# Patient Record
Sex: Male | Born: 1980
Health system: Southern US, Community
[De-identification: ages and names within clinical notes are randomized; demographics above are authoritative.]

## PROBLEM LIST (undated history)

## (undated) DIAGNOSIS — K2270 Barrett's esophagus without dysplasia: Secondary | ICD-10-CM

## (undated) DIAGNOSIS — R011 Cardiac murmur, unspecified: Secondary | ICD-10-CM

## (undated) DIAGNOSIS — E785 Hyperlipidemia, unspecified: Secondary | ICD-10-CM

## (undated) DIAGNOSIS — K219 Gastro-esophageal reflux disease without esophagitis: Secondary | ICD-10-CM

## (undated) HISTORY — DX: Barrett's esophagus without dysplasia: K22.70

## (undated) HISTORY — DX: Gastro-esophageal reflux disease without esophagitis: K21.9

## (undated) HISTORY — DX: Hyperlipidemia, unspecified: E78.5

## (undated) HISTORY — DX: Cardiac murmur, unspecified: R01.1

---

## 2014-04-13 LAB — HEPATIC FUNCTION PANEL
ALT: 38 (ref 10–40)
AST: 21 (ref 14–40)
Alkaline Phosphatase: 87 (ref 25–125)
Bilirubin, Total: 1.1

## 2014-04-13 LAB — BASIC METABOLIC PANEL
BUN: 17 (ref 4–21)
Creatinine: 0.9 (ref <=1.3)
Glucose: 99
POTASSIUM: 4.2 (ref 3.4–5.3)
SODIUM: 139 (ref 137–147)

## 2014-04-13 LAB — LIPID PANEL
CHOLESTEROL: 201 — AB (ref 0–200)
HDL: 57 (ref 35–70)
LDL Cholesterol: 130
Triglycerides: 69 (ref 40–160)

## 2014-11-19 ENCOUNTER — Telehealth: Payer: Self-pay | Admitting: Cardiovascular Disease

## 2014-11-19 ENCOUNTER — Emergency Department: Payer: Self-pay | Admitting: Emergency Medicine

## 2014-11-19 NOTE — Telephone Encounter (Signed)
[facility]/c 11[facility]19-14 timeframe for fu per ED is early next week Wife prefers patient to see Dr. Kirke CorinArida  Patient was admitted to ED today following 911 call for chest pain EKG taken by ems was normal per wife and patients HR in ED was in 40's resting 100's standing BP 90/52  Pt c/o of Chest Pain: STAT if CP now or developed within 24 hours  1. Are you having CP right now? No   2. Are you experiencing any other symptoms (ex. SOB, nausea, vomiting, sweating)? Gray in color sweaty, pain starting in stomach radiating to chest EMS arrived 10-15 minutes later and ekg was normal (per wife)   3. How long have you been experiencing CP? 11-19-14 today first episode  4. Is your CP continuous or coming and going? Single episode this morning   5. Have you taken Nitroglycerin? No    Patient wife was advised that Arida's triage nurse will call her back to ask more detailed questions to determine appt. Date/time   ?

## 2014-11-19 NOTE — Telephone Encounter (Signed)
Informed patient of appt date and time  Advised him to go to the ED if his situation became emergent before then  Patient verbalized understanding

## 2014-11-19 NOTE — Telephone Encounter (Signed)
LVM to inform patient they have been scheduled for 11/29/14 at 11:15 am with Dr. Kirke CorinArida

## 2014-11-19 NOTE — Telephone Encounter (Signed)
This was patients first episode of chest pain per wife no cardiac history never seen by cardiologist   [facility]

## 2014-11-29 ENCOUNTER — Encounter: Payer: Self-pay | Admitting: Cardiovascular Disease

## 2014-11-29 ENCOUNTER — Encounter (INDEPENDENT_AMBULATORY_CARE_PROVIDER_SITE_OTHER): Payer: Self-pay

## 2014-11-29 ENCOUNTER — Ambulatory Visit (INDEPENDENT_AMBULATORY_CARE_PROVIDER_SITE_OTHER): Payer: BLUE CROSS/BLUE SHIELD | Admitting: Cardiovascular Disease

## 2014-11-29 VITALS — BP 114/70 | HR 55 | Ht 70.0 in | Wt 212.0 lb

## 2014-11-29 DIAGNOSIS — R55 Syncope and collapse: Secondary | ICD-10-CM

## 2014-11-29 DIAGNOSIS — R079 Chest pain, unspecified: Secondary | ICD-10-CM | POA: Insufficient documentation

## 2014-11-29 NOTE — Assessment & Plan Note (Signed)
This was likely vasovagal and consistent with his prior history of this event in the setting of blood draws. Continue to monitor.

## 2014-11-29 NOTE — Assessment & Plan Note (Signed)
The chest pain was overall atypical and likely musculoskeletal. However, given his family history of premature coronary artery disease, I requested a stress echocardiogram. He does have sinus bradycardia which is likely chronic. Evaluate for chronotropic incompetence during stress test.

## 2014-11-29 NOTE — Progress Notes (Signed)
  Primary care physician: Dr. Sherryll BurgerShah  HPI  This is a pleasant 34 year old male who was [facility] for evaluation of chest pain or presyncope. He has no previous cardiac history and no significant chronic medical conditions. He is not a smoker and does not drink alcohol. He does have family history of premature coronary artery disease affecting both his father and uncle. However, both of them were smokers. He had a recent episode of sharp left sided chest pain at rest followed by sweating and then feeling extremely dizzy with presyncope but no complete loss of consciousness. He was noted to be bradycardic by EMS with heart rates in the 30s and 40s. Basic workup in the emergency room was unremarkable. His BUN was slightly elevated at 23. He does report history of vasovagal dizziness especially during blood draws. He denies palpitations. No significant exertional symptoms.  No Known Allergies   No current outpatient prescriptions on file prior to visit.   No current facility-administered medications on file prior to visit.     Past Medical History  Diagnosis Date  . Heart murmur   . Hyperlipidemia      History reviewed. No pertinent past surgical history.   Family History  Problem Relation Age of Onset  . Heart attack Father      History   Social History  . Marital Status: Married    Spouse Name: N/A  . Number of Children: N/A  . Years of Education: N/A   Occupational History  . Not on file.   Social History Main Topics  . Smoking status: Never Smoker   . Smokeless tobacco: Not on file  . Alcohol Use: No  . Drug Use: No  . Sexual Activity: Not on file   Other Topics Concern  . Not on file   Social History Narrative  . No narrative on file     ROS A 10 point review of system was performed. It is negative other than that mentioned in the history of present illness.   PHYSICAL EXAM   BP 114/70 mmHg  Pulse 55  Ht 5\' 10"  (1.778 m)   Wt 212 lb (96.163 kg)  BMI 30.42 kg/m2 Constitutional: He is oriented to person, place, and time. He appears well-developed and well-nourished. No distress.  HENT: No nasal discharge.  Head: Normocephalic and atraumatic.  Eyes: Pupils are equal and round.  No discharge. Neck: Normal range of motion. Neck supple. No JVD present. No thyromegaly present.  Cardiovascular: Normal rate, regular rhythm, normal heart sounds. Exam reveals no gallop and no friction rub. No murmur heard.  Pulmonary/Chest: Effort normal and breath sounds normal. No stridor. No respiratory distress. He has no wheezes. He has no rales. He exhibits no tenderness.  Abdominal: Soft. Bowel sounds are normal. He exhibits no distension. There is no tenderness. There is no rebound and no guarding.  Musculoskeletal: Normal range of motion. He exhibits no edema and no tenderness.  Neurological: He is alert and oriented to person, place, and time. Coordination normal.  Skin: Skin is warm and dry. No rash noted. He is not diaphoretic. No erythema. No pallor.  Psychiatric: He has a normal mood and affect. His behavior is normal. Judgment and thought content normal.       ZOX:WRUEAEKG:Sinus  Bradycardia  WITHIN NORMAL LIMITS   ASSESSMENT AND PLAN

## 2014-11-29 NOTE — Patient Instructions (Addendum)
Your physician has requested that you have a stress echocardiogram. For further information please visit [email or web address]. Please follow instruction sheet as given. - You can eat breakfast - take all medications- -where comfortable clothes and shoes that lace up - no perfume, cologne, lotions  Your physician recommends that you schedule a follow-up appointment as needed

## 2014-12-13 ENCOUNTER — Ambulatory Visit
Admit: 2014-12-13 | Disposition: A | Discharge status: Home / Self Care | Payer: Self-pay | Attending: Gastroenterology | Admitting: Gastroenterology

## 2014-12-13 HISTORY — PX: COLONOSCOPY WITH ESOPHAGOGASTRODUODENOSCOPY (EGD): SHX5779

## 2014-12-13 LAB — HM COLONOSCOPY

## 2014-12-28 ENCOUNTER — Ambulatory Visit (INDEPENDENT_AMBULATORY_CARE_PROVIDER_SITE_OTHER): Payer: BLUE CROSS/BLUE SHIELD

## 2014-12-28 DIAGNOSIS — R079 Chest pain, unspecified: Secondary | ICD-10-CM

## 2014-12-28 DIAGNOSIS — R55 Syncope and collapse: Secondary | ICD-10-CM | POA: Diagnosis not present

## 2014-12-31 ENCOUNTER — Telehealth: Payer: Self-pay

## 2014-12-31 NOTE — Telephone Encounter (Signed)
Normal stress echo.             ----- Message -----     From: Scarlette ArFalecha L Clark     Sent: 12/29/2014 11:23 AM      To: Iran OuchMuhammad A Arida, MD        ECHO REPORT SCAN IN UNDER ORDER -LEVEL DOCUMENT

## 2014-12-31 NOTE — Telephone Encounter (Signed)
Left message on pt's vm w/ results. Asked him to call back w/ any questions or concerns.

## 2017-03-22 ENCOUNTER — Telehealth: Payer: Self-pay | Admitting: Family Medicine

## 2017-03-22 DIAGNOSIS — M5441 Lumbago with sciatica, right side: Secondary | ICD-10-CM

## 2017-03-22 MED ORDER — PREDNISONE 10 MG PO TABS: ORAL_TABLET | ORAL | 0 refills | Status: DC

## 2017-03-22 MED ORDER — CYCLOBENZAPRINE HCL 10 MG PO TABS: 5.0000 mg | ORAL_TABLET | Freq: Three times a day (TID) | ORAL | 0 refills | Status: DC | PRN

## 2017-03-22 NOTE — Telephone Encounter (Addendum)
Received notification that patient has been having significant acute low back pain with L sided sciatica symptoms with nerve pain. No acute injury, but thinks related to strain at work. No significant chronic back pain problems before. Requesting some treatment before upcoming new patient evaluation. Agree to do trial course of meds for patient and then follow-up progress in 2 weeks, as scheduled. Otherwise may seek more immediate treatment at ED or Urgent Care. Sent rx prednisone taper and flexeril PRN, relative rest advised.  Jay PilarAlexander Lastacia Solum, DO [facility] 03/22/2017, 9:51 AM

## 2017-04-05 ENCOUNTER — Ambulatory Visit (INDEPENDENT_AMBULATORY_CARE_PROVIDER_SITE_OTHER): Payer: 59 | Admitting: Family Medicine

## 2017-04-05 ENCOUNTER — Encounter: Payer: Self-pay | Admitting: Family Medicine

## 2017-04-05 VITALS — BP 135/78 | HR 61 | Temp 98.2°F | Resp 16 | Ht 70.0 in | Wt 209.0 lb

## 2017-04-05 DIAGNOSIS — Z8739 Personal history of other diseases of the musculoskeletal system and connective tissue: Secondary | ICD-10-CM

## 2017-04-05 DIAGNOSIS — E785 Hyperlipidemia, unspecified: Secondary | ICD-10-CM | POA: Insufficient documentation

## 2017-04-05 DIAGNOSIS — K22719 Barrett's esophagus with dysplasia, unspecified: Secondary | ICD-10-CM | POA: Diagnosis not present

## 2017-04-05 DIAGNOSIS — Z7689 Persons encountering health services in other specified circumstances: Secondary | ICD-10-CM

## 2017-04-05 DIAGNOSIS — E782 Mixed hyperlipidemia: Secondary | ICD-10-CM | POA: Diagnosis not present

## 2017-04-05 DIAGNOSIS — M5441 Lumbago with sciatica, right side: Secondary | ICD-10-CM

## 2017-04-05 DIAGNOSIS — K2100 Gastro-esophageal reflux disease with esophagitis, without bleeding: Secondary | ICD-10-CM

## 2017-04-05 DIAGNOSIS — K2270 Barrett's esophagus without dysplasia: Secondary | ICD-10-CM | POA: Insufficient documentation

## 2017-04-05 NOTE — Patient Instructions (Addendum)
Thank you for coming to the clinic today.  1. Great to meet you! 2. Try to improve a little with some regular exercise and improved diet 3. Stay well hydrated 4. Use sunscreen if out in sun too long 5. I'm not worried about the R lower leg, I think these are superficial blood vessels or capillaries, likely irritated by sun  I think that this is due to Muscle Spasms or strain. Your Sciatic Nerve can be affected causing some of your radiation and numbness down your legs.  If another flare - may take either Ibuprofen or ALeve OTC  If you do aleve it is 1-2 pills  twice daily (12 hrs apart, with food, breakfast and dinner) every day for few days to 1-2 weeks, then can use only as needed  If need muscle relaxant that is fine, can try the flexeril again  May use Tylenol Extra Str 500mg  tabs - may take 1-2 tablets every 6 hours as needed  Recommend to start using heating pad on your lower back 1-2x daily for few weeks  This pain may take weeks to months to fully resolve, but hopefully it will respond to the medicine initially. All back injuries (small or serious) are slow to heal since we use our back muscles every day. Be careful with turning, twisting, lifting, sitting / standing for prolonged periods, and avoid re-injury.  If your symptoms significantly worsen with more pain, or new symptoms with weakness in one or both legs, new or different shooting leg pains, numbness in legs or groin, loss of control or retention of urine or bowel movements, please call back for advice and you may need to go directly to the Emergency Department.  DUE for FASTING BLOOD WORK (no food or drink after midnight before the lab appointment, only water or coffee without cream/sugar on the morning of)  SCHEDULE "Lab Only" visit in the morning at the clinic for lab draw in 3 MONTHS   - Make sure Lab Only appointment is at about 1 week before your next appointment, so that results will be available  For Lab  Results, once available within 2-3 days of blood draw, you can can log in to MyChart online to view your results and a brief explanation. Also, we can discuss results at next follow-up visit.   Please schedule a Follow-up Appointment to: Return in about 3 months (around 07/06/2017) for Annual Physical.  If you have any other questions or concerns, please feel free to call the clinic or send a message through MyChart. You may also schedule an earlier appointment if necessary.  Additionally, you may be receiving a survey about your experience at our clinic within a few days to 1 week by e-mail or mail. We value your feedback.  Saralyn PilarAlexander Kani Jobson, DO [facility]             Low Back Pain Exercises  See other page with pictures of each exercise.  Start with 1 or 2 of these exercises that you are most comfortable with. Do not do any exercises that cause you significant worsening pain. Some of these may cause some "stretching soreness" but it should go away after you stop the exercise, and get better over time. Gradually increase up to 3-4 exercises as tolerated.  Standing hamstring stretch: Place the heel of your leg on a stool about 15 inches high. Keep your knee straight. Lean forward, bending at the hips until you feel a mild stretch in the  back of your thigh. Make sure you do not roll your shoulders and bend at the waist when doing this or you will stretch your lower back instead. Hold the stretch for 15 to 30 seconds. Repeat 3 times. Repeat the same stretch on your other leg.  Cat and camel: Get down on your hands and knees. Let your stomach sag, allowing your back to curve downward. Hold this position for 5 seconds. Then arch your back and hold for 5 seconds. Do 3 sets of 10.  Quadriped Arm/Leg Raises: Get down on your hands and knees. Tighten your abdominal muscles to stiffen your spine. While keeping your abdominals tight, raise one arm and the opposite  leg away from you. Hold this position for 5 seconds. Lower your arm and leg slowly and alternate sides. Do this 10 times on each side.  Pelvic tilt: Lie on your back with your knees bent and your feet flat on the floor. Tighten your abdominal muscles and push your lower back into the floor. Hold this position for 5 seconds, then relax. Do 3 sets of 10.  Partial curl: Lie on your back with your knees bent and your feet flat on the floor. Tighten your stomach muscles and flatten your back against the floor. Tuck your chin to your chest. With your hands stretched out in front of you, curl your upper body forward until your shoulders clear the floor. Hold this position for 3 seconds. Don't hold your breath. It helps to breathe out as you lift your shoulders up. Relax. Repeat 10 times. Build to 3 sets of 10. To challenge yourself, clasp your hands behind your head and keep your elbows out to the side.  Lower trunk rotation: Lie on your back with your knees bent and your feet flat on the floor. Tighten your abdominal muscles and push your lower back into the floor. Keeping your shoulders down flat, gently rotate your legs to one side, then the other as far as you can. Repeat 10 to 20 times.  Single knee to chest stretch: Lie on your back with your legs straight out in front of you. Bring one knee up to your chest and grasp the back of your thigh. Pull your knee toward your chest, stretching your buttock muscle. Hold this position for 15 to 30 seconds and return to the starting position. Repeat 3 times on each side.  Double knee to chest: Lie on your back with your knees bent and your feet flat on the floor. Tighten your abdominal muscles and push your lower back into the floor. Pull both knees up to your chest. Hold for 5 seconds and repeat 10 to 20 times.

## 2017-04-05 NOTE — Progress Notes (Signed)
Subjective:    Patient ID: Jay Buchanan, male    DOB: 13-Mar-1981, 36 y.o.   MRN: [id number]  Jay Buchanan is a 36 y.o. male presenting on 04/05/2017 for Establish Care  [facility]  HPI   Here to get established, and plans to follow-up in future for Annual Physical. He has some records from prior PCP with him today.  HYPERLIPIDEMIA / Lifestyle: - Reports no specific concerns. 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, sometimes pallets are very heavy, but no significant injury except x 1 slip and fall frozen truck >1 year ago no real injury - Interval history L LBP and radiating pain down to knee, not below, intermittent tingling and numbness, not affecting him if he was up and walking around, worse if stop or prolonged sitting recently treated with prednisone burst and flexeril 2 weeks ago, improved on prednisone since he had sciatica symptoms, and no significant relief on flexeril, stopped taking it - Takes occasional Ibuprofen few pills at night (x 4 of 200mg ) PRN, infrequent 1-2 x monthly - Rarely takes Tylenol - History of shoulder problems 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, 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 does this and his shoulders are fine - No prior back imaging or x-ray  GERD / Barrett's Esophagus Followed by Dr [facility], had EGD and Colonoscopy on 12/13/14, see chart for details, review with EGD for GERD and dx with esophagitis and found to have Barrett's esophagus - Continues  Nexium 20mg  daily, worse symptoms if off of it, tomatoes flare - Also history of hematochezia, and proceeded colonoscopy, dx with anal fissure without other abnormality, no polyps  Additional history episode in 2016 had problems with chest pain vs pre-syncope with dyspnea, evaluated by Cardiology Dr Kirke CorinArida in 11/2014, after sent to ED, had HR with bradycardia in 30-40s, thought to have vasovagal episode due to blood draws at ED, and chest pain determined to be atypical likely MSK vs GERD. He proceeded with a Stress ECHO test, in 12/2014 that was normal. - No further concerns.  Health Maintenance: - Declines routine HIV screen - Last colonoscopy 12/13/14 (diagnostic due to hematochezia, not screening) normal without polyps - not due again until age 36 most likely   Past Medical History:  Diagnosis Date  . Barrett's esophagus   . GERD (gastroesophageal reflux disease)   . Heart murmur   . Hyperlipidemia    History reviewed. No pertinent surgical history. Social History   Social History  . Marital status: Married    Spouse name: Oneal Groutshley Carol  . Number of children: 2  . Years of education: College   Occupational History  . 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 Smoking status: Never Smoker  . Smokeless tobacco: Never Used  . Alcohol use No  . Drug use: No  . Sexual activity: Not on file   Other Topics Concern  . Not on file   Social  History Narrative  . No narrative on file   Family History  Problem Relation Age of Onset  . Diabetes Father   . Cancer Father        skin  . Hypertension Father   . Prostate cancer Father   . Barrett's esophagus Father   . Heart disease Father   . Lung cancer Maternal Grandfather   . Bladder Cancer Maternal Grandfather    Current Outpatient Prescriptions on File Prior to Visit  Medication Sig  . esomeprazole (NEXIUM) 20 MG capsule Take 20 mg by mouth daily at 12 noon.  . cyclobenzaprine  (FLEXERIL) 10 MG tablet Take 0.5-1 tablets (5-10 mg total) by mouth 3 (three) times daily as needed for muscle spasms. (Patient not taking: Reported on 04/05/2017)   No current facility-administered medications on file prior to visit.     Review of Systems  Constitutional: Negative for activity change, appetite change, chills, diaphoresis, fatigue, fever and unexpected weight change.  HENT: Negative for congestion, hearing loss and sinus pressure.   Eyes: Negative for visual disturbance.  Respiratory: Negative for apnea, cough, chest tightness, shortness of breath and wheezing.   Cardiovascular: Negative for chest pain, palpitations and leg swelling.  Gastrointestinal: Negative for abdominal pain, anal bleeding, blood in stool, constipation, diarrhea, nausea and vomiting.  Endocrine: Negative for cold intolerance and polyuria.  Genitourinary: Negative for difficulty urinating, dysuria, frequency, hematuria and testicular pain.  Musculoskeletal: Positive for back pain (Improved now, L lower). Negative for arthralgias and neck pain.  Skin: Negative for rash.  Allergic/Immunologic: Negative for environmental allergies.  Neurological: Negative for dizziness, weakness, light-headedness, numbness and headaches.  Hematological: Negative for adenopathy.  Psychiatric/Behavioral: Negative for behavioral problems, dysphoric mood and sleep disturbance. The patient is not nervous/anxious.    Per HPI unless specifically indicated above     Objective:    BP 135/78   Pulse 61   Temp 98.2 F (36.8 C) (Oral)   Resp 16   Ht 5\' 10"  (1.778 m)   Wt 209 lb (94.8 kg)   BMI 29.99 kg/m   Wt Readings from Last 3 Encounters:  04/05/17 209 lb (94.8 kg)  11/29/14 212 lb (96.2 kg)    Physical Exam  Constitutional: He is oriented to person, place, and time. He appears well-developed and well-nourished. No distress.  Well-appearing, comfortable, cooperative  HENT:  Head: Normocephalic and atraumatic.    Mouth/Throat: Oropharynx is clear and moist.  Eyes: Conjunctivae are normal. Right eye exhibits no discharge. Left eye exhibits no discharge.  Neck: Normal range of motion. Neck supple. No thyromegaly present.  No carotid bruits  Cardiovascular: Normal rate, regular rhythm, normal heart sounds and intact distal pulses.   No murmur heard. Pulmonary/Chest: Effort normal and breath sounds normal. No respiratory distress. He has no wheezes. He has no rales.  Musculoskeletal: Normal range of motion. He exhibits no edema.  Low Back Inspection: Normal appearance, no spinal deformity, symmetrical. Palpation: No tenderness over spinous processes. Bilateral lumbar paraspinal muscles non-tender and without hypertonicity/spasm. ROM: Full active ROM forward flex / back extension, rotation L/R without discomfort Special Testing: Seated SLR negative for radicular pain bilaterally but with noticeable tightness in hamstrings and inflexibility of core/back, difficult to raise leg and not lean back due to some tight muscles  Strength: Bilateral hip flex/ext 5/5, knee flex/ext 5/5, ankle dorsiflex/plantarflex 5/5 Neurovascular: intact distal sensation to light touch  Lymphadenopathy:    He has no cervical adenopathy.  Neurological: He is alert and oriented to person,  place, and time.  Distal sensation intact to light touch  Skin: Skin is warm and dry. No rash noted. He is not diaphoretic. No erythema.  Right posterior calf with appearance of superficial capillaries, see picture, non tender, blanching, leg otherwise symmetrical  Psychiatric: He has a normal mood and affect. His behavior is normal.  Well groomed, good eye contact, normal speech and thoughts  Nursing note and vitals reviewed.   Right Leg posteriorly    Results for orders placed or performed in visit on 04/05/17  Basic metabolic panel  Result Value Ref Range   Glucose 99    BUN 17 4 - 21   Creatinine 0.9 0.6 - 1.3   Potassium 4.2 3.4 -  5.3   Sodium 139 137 - 147  Lipid panel  Result Value Ref Range   Triglycerides 69 40 - 160   Cholesterol 201 (A) 0 - 200   HDL 57 35 - 70   LDL Cholesterol 130   Hepatic function panel  Result Value Ref Range   Alkaline Phosphatase 87 25 - 125   ALT 38 10 - 40   AST 21 14 - 40   Bilirubin, Total 1.1   HM COLONOSCOPY  Result Value Ref Range   HM Colonoscopy See Report (in chart) See Report (in chart), Patient Reported      Assessment & Plan:   Problem List Items Addressed This Visit    Hyperlipidemia    Previously only mild abnormal result elevated LDL in 2015 Some poor lifestyle habits Last lipid panel 2015  Plan: 1. Check future fasting lipids with upcoming labs / annual phys few months 2. Encourage improved lifestyle - low carb/cholesterol, reduce portion size, continue improving start regular exercise      History of back pain    Resolved acute flare No significant chronic back pain but has had minor flares periodically, usually self limited. No significant injury or pathology dx - No prior imaging Follow-up as needed in future, likely consider Lumbar X-ray if recurrence      GERD (gastroesophageal reflux disease)    Stable, on chronic PPI esomeprazole 20mg  daily OTC Followed by Dr [facility]ando [facility] in past, last EGD 12/2014 with dx Barrett's and esophagitis - Avoid triggers - Follow-up as planned with GI for repeat EGD surveillance likely q 3-5 years      Barrett's esophagus    Stable, on chronic PPI esomeprazole 20mg  daily OTC Followed by Dr [facility]ando [facility] in past, last EGD 12/2014 with dx Barrett's - Avoid triggers - Follow-up as planned with GI for repeat EGD surveillance likely q 3-5 years       Other Visit Diagnoses    Acute left-sided low back pain with right-sided sciatica    -  Primary   Resolved on pred taper. Uncertain trigger, no significant known back pathology, likely reptitive from work.   Encounter to establish care with new doctor          Additionally R  leg posterior calf appears to have some fragile capillaries in distinct area, no alarming symptoms, or concerns, has been stable for while. Will follow.  No orders of the defined types were placed in this encounter.   Follow up plan: Return in about 3 months (around 07/06/2017) for Annual Physical.   Saralyn PilarAlexander Marnisha Stampley, DO [facility] 04/06/2017, 10:20 AM

## 2017-04-06 ENCOUNTER — Other Ambulatory Visit: Payer: Self-pay | Admitting: Family Medicine

## 2017-04-06 ENCOUNTER — Encounter: Payer: Self-pay | Admitting: Family Medicine

## 2017-04-06 DIAGNOSIS — Z8739 Personal history of other diseases of the musculoskeletal system and connective tissue: Secondary | ICD-10-CM | POA: Insufficient documentation

## 2017-04-06 DIAGNOSIS — Z0000 Encounter for general adult medical examination without abnormal findings: Secondary | ICD-10-CM

## 2017-04-06 DIAGNOSIS — K2100 Gastro-esophageal reflux disease with esophagitis, without bleeding: Secondary | ICD-10-CM

## 2017-04-06 DIAGNOSIS — E782 Mixed hyperlipidemia: Secondary | ICD-10-CM

## 2017-04-06 DIAGNOSIS — R799 Abnormal finding of blood chemistry, unspecified: Secondary | ICD-10-CM

## 2017-04-06 DIAGNOSIS — K219 Gastro-esophageal reflux disease without esophagitis: Secondary | ICD-10-CM | POA: Insufficient documentation

## 2017-04-06 DIAGNOSIS — Z833 Family history of diabetes mellitus: Secondary | ICD-10-CM

## 2017-04-06 NOTE — Assessment & Plan Note (Signed)
Previously only mild abnormal result elevated LDL in 2015 Some poor lifestyle habits Last lipid panel 2015  Plan: 1. Check future fasting lipids with upcoming labs / annual phys few months 2. Encourage improved lifestyle - low carb/cholesterol, reduce portion size, continue improving start regular exercise

## 2017-04-06 NOTE — Assessment & Plan Note (Signed)
Stable, on chronic PPI esomeprazole 20mg  daily OTC Followed by Dr [facility]ando [facility] in past, last EGD 12/2014 with dx Barrett's - Avoid triggers - Follow-up as planned with GI for repeat EGD surveillance likely q 3-5 years

## 2017-04-06 NOTE — Assessment & Plan Note (Signed)
Stable, on chronic PPI esomeprazole 20mg  daily OTC Followed by Dr [facility]ando [facility] in past, last EGD 12/2014 with dx Barrett's and esophagitis - Avoid triggers - Follow-up as planned with GI for repeat EGD surveillance likely q 3-5 years

## 2017-04-06 NOTE — Assessment & Plan Note (Signed)
Resolved acute flare No significant chronic back pain but has had minor flares periodically, usually self limited. No significant injury or pathology dx - No prior imaging Follow-up as needed in future, likely consider Lumbar X-ray if recurrence

## 2017-06-27 ENCOUNTER — Other Ambulatory Visit: Payer: 59

## 2017-06-27 DIAGNOSIS — Z833 Family history of diabetes mellitus: Secondary | ICD-10-CM | POA: Diagnosis not present

## 2017-06-27 DIAGNOSIS — R740 Nonspecific elevation of levels of transaminase and lactic acid dehydrogenase [LDH]: Secondary | ICD-10-CM

## 2017-06-27 DIAGNOSIS — E782 Mixed hyperlipidemia: Secondary | ICD-10-CM

## 2017-06-27 DIAGNOSIS — R7401 Elevation of levels of liver transaminase levels: Secondary | ICD-10-CM

## 2017-06-27 DIAGNOSIS — R799 Abnormal finding of blood chemistry, unspecified: Secondary | ICD-10-CM

## 2017-06-27 DIAGNOSIS — Z0000 Encounter for general adult medical examination without abnormal findings: Secondary | ICD-10-CM

## 2017-06-28 LAB — LIPID PANEL
CHOL/HDL RATIO: 3.1 (calc) (ref <5.0)
Cholesterol: 192 mg/dL (ref <200)
HDL: 62 mg/dL (ref >40)
LDL Cholesterol (Calc): 115 mg/dL (calc) — ABNORMAL HIGH
Non-HDL Cholesterol (Calc): 130 mg/dL (calc) — ABNORMAL HIGH (ref <130)
Triglycerides: 49 mg/dL (ref <150)

## 2017-06-28 LAB — COMPLETE METABOLIC PANEL WITHOUT GFR
AG Ratio: 1.7 (calc) (ref 1.0–2.5)
ALT: 61 U/L — ABNORMAL HIGH (ref 9–46)
AST: 31 U/L (ref 10–40)
Albumin: 4.2 g/dL (ref 3.6–5.1)
Alkaline phosphatase (APISO): 129 U/L — ABNORMAL HIGH (ref 40–115)
BUN: 17 mg/dL (ref 7–25)
CO2: 28 mmol/L (ref 20–32)
Calcium: 8.9 mg/dL (ref 8.6–10.3)
Chloride: 101 mmol/L (ref 98–110)
Creat: 0.91 mg/dL (ref 0.60–1.35)
GFR, Est African American: 124 mL/min/{1.73_m2}
GFR, Est Non African American: 107 mL/min/{1.73_m2}
Globulin: 2.5 g/dL (ref 1.9–3.7)
Glucose, Bld: 96 mg/dL (ref 65–99)
Potassium: 4.3 mmol/L (ref 3.5–5.3)
Sodium: 137 mmol/L (ref 135–146)
Total Bilirubin: 1 mg/dL (ref 0.2–1.2)
Total Protein: 6.7 g/dL (ref 6.1–8.1)

## 2017-06-28 LAB — CBC WITH DIFFERENTIAL/PLATELET
Basophils Absolute: 51 {cells}/uL (ref 0–200)
Basophils Relative: 0.8 %
Eosinophils Absolute: 480 {cells}/uL (ref 15–500)
Eosinophils Relative: 7.5 %
HCT: 41.4 % (ref 38.5–50.0)
Hemoglobin: 14.8 g/dL (ref 13.2–17.1)
Lymphs Abs: 1882 {cells}/uL (ref 850–3900)
MCH: 29.5 pg (ref 27.0–33.0)
MCHC: 35.7 g/dL (ref 32.0–36.0)
MCV: 82.6 fL (ref 80.0–100.0)
MPV: 10.2 fL (ref 7.5–12.5)
Monocytes Relative: 13 %
Neutro Abs: 3155 {cells}/uL (ref 1500–7800)
Neutrophils Relative %: 49.3 %
Platelets: 260 10*3/uL (ref 140–400)
RBC: 5.01 Million/uL (ref 4.20–5.80)
RDW: 12.6 % (ref 11.0–15.0)
Total Lymphocyte: 29.4 %
WBC mixed population: 832 {cells}/uL (ref 200–950)
WBC: 6.4 10*3/uL (ref 3.8–10.8)

## 2017-06-28 LAB — HEMOGLOBIN A1C
Hgb A1c MFr Bld: 5.3 %{Hb}
Mean Plasma Glucose: 105 (calc)
eAG (mmol/L): 5.8 (calc)

## 2017-07-01 DIAGNOSIS — R740 Nonspecific elevation of levels of transaminase and lactic acid dehydrogenase [LDH]: Secondary | ICD-10-CM

## 2017-07-01 DIAGNOSIS — R7401 Elevation of levels of liver transaminase levels: Secondary | ICD-10-CM | POA: Insufficient documentation

## 2017-07-02 ENCOUNTER — Other Ambulatory Visit: Payer: 59

## 2017-07-05 ENCOUNTER — Ambulatory Visit (INDEPENDENT_AMBULATORY_CARE_PROVIDER_SITE_OTHER): Payer: 59 | Admitting: Family Medicine

## 2017-07-05 ENCOUNTER — Other Ambulatory Visit: Payer: Self-pay | Admitting: Family Medicine

## 2017-07-05 ENCOUNTER — Encounter: Payer: Self-pay | Admitting: Family Medicine

## 2017-07-05 VITALS — BP 125/75 | HR 51 | Temp 98.1°F | Resp 16 | Ht 70.0 in | Wt 212.0 lb

## 2017-07-05 DIAGNOSIS — M6530 Trigger finger, unspecified finger: Secondary | ICD-10-CM | POA: Diagnosis not present

## 2017-07-05 DIAGNOSIS — K2100 Gastro-esophageal reflux disease with esophagitis, without bleeding: Secondary | ICD-10-CM

## 2017-07-05 DIAGNOSIS — M150 Primary generalized (osteo)arthritis: Secondary | ICD-10-CM | POA: Diagnosis not present

## 2017-07-05 DIAGNOSIS — E782 Mixed hyperlipidemia: Secondary | ICD-10-CM | POA: Diagnosis not present

## 2017-07-05 DIAGNOSIS — K22719 Barrett's esophagus with dysplasia, unspecified: Secondary | ICD-10-CM | POA: Diagnosis not present

## 2017-07-05 DIAGNOSIS — M159 Polyosteoarthritis, unspecified: Secondary | ICD-10-CM | POA: Insufficient documentation

## 2017-07-05 DIAGNOSIS — R740 Nonspecific elevation of levels of transaminase and lactic acid dehydrogenase [LDH]: Secondary | ICD-10-CM

## 2017-07-05 DIAGNOSIS — E7800 Pure hypercholesterolemia, unspecified: Secondary | ICD-10-CM

## 2017-07-05 DIAGNOSIS — Z0000 Encounter for general adult medical examination without abnormal findings: Secondary | ICD-10-CM

## 2017-07-05 DIAGNOSIS — R7401 Elevation of levels of liver transaminase levels: Secondary | ICD-10-CM

## 2017-07-05 DIAGNOSIS — R799 Abnormal finding of blood chemistry, unspecified: Secondary | ICD-10-CM

## 2017-07-05 NOTE — Patient Instructions (Addendum)
Thank you for coming to the clinic today.  1.  Blood work overall looks very good.  ALT mildly elevated 61, I am not concerned, again most likely related to cholesterol or genetics in some way, in future if still elevated we could check a liver ultrasound.  For your Barrett's esophagus, recommend return to [facility] in April 2019 - can call them early 2019 to check status and schedule apt, if need new referral let me know.  GASTROENTEROLOGY (GI)  [facility]([place]) [address] [place], [place] [address] Phone: [phone]  [facility]([place]) [address] [place], [place] [address] Main: [phone]  I think you have a Trigger Finger of R hand index/middle fingers, this is mild and may gradually get worse if you continue repetitive activity with grip R hand. If it gets stuck and causes pain or swelling, we can consider further evaluation and maybe referral to Orthopedics, rarely need a minor surgery if this is severe.  Likely arthritis in several joints, only mild. We can hold off on x-rays for now, may need in future.  DUE for FASTING BLOOD WORK (no food or drink after midnight before the lab appointment, only water or coffee without cream/sugar on the morning of)  SCHEDULE "Lab Only" visit in the morning at the clinic for lab draw in 1 YEAR  - Make sure Lab Only appointment is at about 1 week before your next appointment, so that results will be available  For Lab Results, once available within 2-3 days of blood draw, you can can log in to MyChart online to view your results and a brief explanation. Also, we can discuss results at next follow-up visit.   Please schedule a Follow-up Appointment to: Return in about 1 year (around 07/05/2018) for Annual Physical.  If you have any other questions or concerns, please feel free to call the clinic or send a message through MyChart. You may also schedule an earlier  appointment if necessary.  Additionally, you may be receiving a survey about your experience at our clinic within a few days to 1 week by e-mail or mail. We value your feedback.  Saralyn PilarAlexander Karamalegos, DO [facility]

## 2017-07-05 NOTE — Progress Notes (Signed)
Subjective:    Patient ID: Jay Buchanan, male    DOB: 1981/01/16, 36 y.o.   MRN: [id number]  Jay Buchanan is a 36 y.o. male presenting on 07/05/2017 for Annual Exam  Here for Annual Physical and Lab Review  HPI   HYPERLIPIDEMIA / Lifestyle: - Reports no specific concerns. Last lipid panel 06/2017, still mild elevated LDL otherwise normal - Never been on any cholesterol medication Lifestyle - Diet: Tries to eat better at work, more home cooked meals, less fast food, mostly balanced, now trying to improve water intake - Exercise: Limited exercise, admits needs to improve, only active when playing with kids outside, sports and other activities, no regular exercise  GERD / Barrett's Esophagus - Followed by Dr [facility], had EGD and Colonoscopy on 12/13/14, see chart for details, review with EGD for GERD and dx with esophagitis and found to have Barrett's esophagus - Continues OTC Nexium 20mg  daily, worse symptoms if off of it, tomatoes and trigger foods flare - Also history of hematochezia, and proceeded colonoscopy, dx with anal fissure without other abnormality, no polyps - Today asking when to return to GI for repeat EGD for surveillance. - No new concerns  FOLLOW-UP LOW BACK PAIN / Presumed Osteoarthritis Multiple Joints / R-Hand Trigger Fingers - Last visit with me 04/05/17, for initial visit for establish care and reviewed same problem, he was previously treated with prednisone burst in early 03/2017 due to acute back pain. His back pain has since resolved, see prior notes for background information. - Interval update with no further flares of back pain. - Today patient reports no new concerns. He admits to occasional L>R knee aches at times if active and on feet a lot, no known injury or problem. No knee swelling. Takes very rare Tylenol. No regular medicines for it. - Also endorses prior history of R shoulder problem with repetitive overhead activities at work - He is R handed, also  describes some stiffness in R index/middle fingers sometimes if grip for long time will have difficulty straightening out his fingers or using them  Additional topics: - Elevated ALT on labs - no prior abnormality, previous normal 3 years ago. No alcohol consumption. - He has a low resting pulse - Has known predictable vagal response to blood draws, last time tolerated well with laying down and knees flex up - Occasional snoring without any known apnea episodes  Health Maintenance: - UTD Flu Shot  - Colon CA Screening: Last Colonoscopy 12/13/14 (done by Dr [facility]), results with no polyps, diagnostic for hematochezia without abnormality, good until age 45. Currently asymptomatic. No known family history of colon CA.  - Prostate CA Screening: No prior prostate CA screening, age 52 currently. Currently asymptomatic. Known family history of prostate CA with father unsure exact age approx 78 to 9. Agree to screen with PSA at about age 53.  Depression screen [facility] 2/9 07/05/2017 04/05/2017  Decreased Interest 0 0  Down, Depressed, Hopeless 0 0  PHQ - 2 Score 0 0    Past Medical History:  Diagnosis Date  . Barrett's esophagus   . GERD (gastroesophageal reflux disease)   . Heart murmur   . Hyperlipidemia    History reviewed. No pertinent surgical history. Social History   Social History  . Marital status: Married    Spouse name: Jamaal Bernasconi  . Number of children: 2  . Years of education: College   Occupational History  . 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 Smoking status: Never Smoker  . Smokeless tobacco: Never Used  . Alcohol use No  . Drug use: No  . Sexual activity: Not on file   Other Topics Concern  . Not on file   Social History Narrative  . No narrative on file   Family History  Problem Relation Age of Onset  . Diabetes Father   . Cancer Father        skin  . Hypertension Father   . Prostate cancer Father         late 49s early 40s  . Barrett's esophagus Father   . Heart disease Father   . Lung cancer Maternal Grandfather   . Bladder Cancer Maternal Grandfather    Current Outpatient Prescriptions on File Prior to Visit  Medication Sig  . esomeprazole (NEXIUM) 20 MG capsule Take 20 mg by mouth daily at 12 noon.   No current facility-administered medications on file prior to visit.     Review of Systems  Constitutional: Negative for activity change, appetite change, chills, diaphoresis, fatigue and fever.  HENT: Negative for congestion, hearing loss and sinus pressure.   Eyes: Negative for visual disturbance.  Respiratory: Negative for apnea, cough, choking, chest tightness, shortness of breath and wheezing.        Occasional snoring  Cardiovascular: Negative for chest pain, palpitations and leg swelling.  Gastrointestinal: Negative for abdominal pain, anal bleeding, blood in stool, constipation, diarrhea, nausea and vomiting.  Endocrine: Negative for cold intolerance.  Genitourinary: Negative for decreased urine volume, difficulty urinating, dysuria, frequency, hematuria, testicular pain and urgency.  Musculoskeletal: Negative for arthralgias, back pain and neck pain.       Right hand index/middle finger stiffness  Skin: Negative for rash.  Allergic/Immunologic: Negative for environmental allergies.  Neurological: Negative for dizziness, weakness, light-headedness, numbness and headaches.  Hematological: Negative for adenopathy.  Psychiatric/Behavioral: Negative for behavioral problems, dysphoric mood and sleep disturbance. The patient is not nervous/anxious.    Per HPI unless specifically indicated above     Objective:    BP 125/75   Pulse (!) 51   Temp 98.1 F (36.7 C) (Oral)   Resp 16   Ht 5\' 10"  (1.778 m)   Wt 212 lb (96.2 kg)   BMI 30.42 kg/m   Wt Readings from Last 3 Encounters:  07/05/17 212 lb (96.2 kg)  04/05/17 209 lb (94.8 kg)  11/29/14 212 lb (96.2 kg)      Physical Exam  Constitutional: He is oriented to person, place, and time. He appears well-developed and well-nourished. No distress.  Well-appearing, comfortable, cooperative  HENT:  Head: Normocephalic and atraumatic.  Mouth/Throat: Oropharynx is clear and moist.  Nares patent without purulence or edema. Bilateral TMs clear without erythema, effusion or bulging. Oropharynx clear without erythema, exudates, edema or asymmetry.  Mallampati Score 1 - Complete visualization of entire oropharynx soft palate  Eyes: Pupils are equal, round, and reactive to light. Conjunctivae and EOM are normal. Right eye exhibits no discharge. Left eye exhibits no discharge.  Neck: Normal range of motion. Neck supple. No thyromegaly present.  No carotid bruits  Cardiovascular: Normal rate, regular rhythm, normal heart sounds and intact distal pulses.   No murmur heard. Pulmonary/Chest: Effort normal and breath sounds normal. No respiratory distress. He has no wheezes. He has no rales.  Abdominal: Soft. Bowel sounds are normal. He exhibits no distension and no mass. There is no tenderness.  Musculoskeletal:  Normal range of motion. He exhibits no edema or tenderness.  Upper / Lower Extremities: - Normal muscle tone, strength bilateral upper extremities 5/5, lower extremities 5/5  Bilateral Hands: - Palms with some mild callus thickening formation on palmar aspect of MCPs lateral hands bilateral and R palm index/middle fingers - Slight stiffness with ROM for R index/middle finger flex/ext, bulky area over palmar tendon sheaths, without obvious triggering today - No edema  Knees Bilateral - Normal appearance, without deformity or effusion - Full active ROM - No palpable crepitus - Non tender  Lymphadenopathy:    He has no cervical adenopathy.  Neurological: He is alert and oriented to person, place, and time.  Distal sensation intact to light touch all extremities  Skin: Skin is warm and dry. No rash  noted. He is not diaphoretic. No erythema.  Psychiatric: He has a normal mood and affect. His behavior is normal.  Well groomed, good eye contact, normal speech and thoughts  Nursing note and vitals reviewed.  Results for orders placed or performed in visit on 06/27/17  COMPLETE METABOLIC PANEL WITH GFR  Result Value Ref Range   Glucose, Bld 96 65 - 99 mg/dL   BUN 17 7 - 25 mg/dL   Creat 1.61 0.96 - 0.45 mg/dL   GFR, Est Non African American 107 > OR = 60 mL/min/1.58m2   GFR, Est African American 124 > OR = 60 mL/min/1.38m2   BUN/Creatinine Ratio NOT APPLICABLE 6 - 22 (calc)   Sodium 137 135 - 146 mmol/L   Potassium 4.3 3.5 - 5.3 mmol/L   Chloride 101 98 - 110 mmol/L   CO2 28 20 - 32 mmol/L   Calcium 8.9 8.6 - 10.3 mg/dL   Total Protein 6.7 6.1 - 8.1 g/dL   Albumin 4.2 3.6 - 5.1 g/dL   Globulin 2.5 1.9 - 3.7 g/dL (calc)   AG Ratio 1.7 1.0 - 2.5 (calc)   Total Bilirubin 1.0 0.2 - 1.2 mg/dL   Alkaline phosphatase (APISO) 129 (H) 40 - 115 U/L   AST 31 10 - 40 U/L   ALT 61 (H) 9 - 46 U/L  CBC with Differential/Platelet  Result Value Ref Range   WBC 6.4 3.8 - 10.8 Thousand/uL   RBC 5.01 4.20 - 5.80 Million/uL   Hemoglobin 14.8 13.2 - 17.1 g/dL   HCT 40.9 81.1 - 91.4 %   MCV 82.6 80.0 - 100.0 fL   MCH 29.5 27.0 - 33.0 pg   MCHC 35.7 32.0 - 36.0 g/dL   RDW 78.2 95.6 - 21.3 %   Platelets 260 140 - 400 Thousand/uL   MPV 10.2 7.5 - 12.5 fL   Neutro Abs 3,155 1,500 - 7,800 cells/uL   Lymphs Abs 1,882 850 - 3,900 cells/uL   WBC mixed population 832 200 - 950 cells/uL   Eosinophils Absolute 480 15 - 500 cells/uL   Basophils Absolute 51 0 - 200 cells/uL   Neutrophils Relative % 49.3 %   Total Lymphocyte 29.4 %   Monocytes Relative 13.0 %   Eosinophils Relative 7.5 %   Basophils Relative 0.8 %  Lipid panel  Result Value Ref Range   Cholesterol 192 <200 mg/dL   HDL 62 >08 mg/dL   Triglycerides 49 <657 mg/dL   LDL Cholesterol (Calc) 115 (H) mg/dL (calc)   Total CHOL/HDL Ratio  3.1 <5.0 (calc)   Non-HDL Cholesterol (Calc) 130 (H) <130 mg/dL (calc)  Hemoglobin Q4O  Result Value Ref Range   Hgb A1c MFr Bld  5.3 <5.7 % of total Hgb   Mean Plasma Glucose 105 (calc)   eAG (mmol/L) 5.8 (calc)      Assessment & Plan:   Problem List Items Addressed This Visit    Barrett's esophagus    Stable, on chronic PPI esomeprazole 20mg  daily OTC Followed by Dr [facility] in past, last EGD 12/2014 with dx Barrett's - Avoid triggers - Follow-up as planned with GI for repeat EGD surveillance likely soonest within 3 years, anticipated 12/2017, recommended that he contact Dr [facility] for scheduling, and if need referral we can provide      Elevated ALT measurement    Uncertain exact etiology, mild elevation isolated ALT. Comparison labs 3 years ago with normal LFTs. No alcohol consumption. Possibly secondary to mild elevated LDL cholesterol vs genetic predisposition fatty liver, also with elevated BMI >30 and limited regular exercise  Plan: 1. Reassurance 2. Improve lifestyle regular exercise 3. Follow-up yearly lab with LFTs in 06/2018 for trend 4. If still elevated would consider RUQ abdominal US for liver eval      GERD (gastroesophageal reflux disease)    Stable, well controlled on PPI No GI red flags - known complication with Barrett's esophagus followed by Dr [facility] last EGD 12/2014 Improved symptoms if continues to avoid food triggers Continue OTC Nexium 20mg  daily, notify office if need rx Follow-up as planned with [facility] likely in 12/2017      Hyperlipidemia    Mostly controlled cholesterol except mild elevated LDL on lifestyle - now with less exercise Last lipid panel 06/2017 Calculated ASCVD 10 yr risk score = low, age < 40  Plan: 1. Briefly reviewed ASCVD risk in general 2. Encourage improved lifestyle - low carb/cholesterol, reduce portion size, start regular exercise 3. Follow-up yearly lipids      Osteoarthritis of multiple joints    Presumed clinical  diagnosis with multiple joint issues, mostly minor. No x-ray imaging to confirm OA/DJD History of R shoulder bursitis, low back pain, and intermittent L>R knee aches, also R hand stiffness possible trigger finger  Plan: 1. Reassurance overall likely mild early OA/DJD 2. May take Tylenol PRN, other conservative therapy ice/heat, compression, rest, avoid injury 3. Future may consider X-rays if need, possibly knees if worse 4. Follow-up as needed      Trigger finger of right hand    Clinically consistent with early mild R trigger finger index/middle with stiffness and some difficulty extending if flexed Secondary to repetitive work, often has grip for long time, has wear and callus formation on palmar aspect R handed  1. Reassurance likely early problem, may be related to possible underlying OA/DJD 2. May take Tylenol PRN, other conservative therapy ice/heat, compression, rest, avoid injury 3. No need for x-rays 4. Follow-up as needed       Other Visit Diagnoses    Annual physical exam    -  Primary      No orders of the defined types were placed in this encounter.   Follow up plan: Return in about 1 year (around 07/05/2018) for Annual Physical.  Saralyn PilarAlexander Denishia Citro, DO [facility] 07/05/2017, 1:22 PM

## 2017-07-05 NOTE — Assessment & Plan Note (Signed)
Mostly controlled cholesterol except mild elevated LDL on lifestyle - now with less exercise Last lipid panel 06/2017 Calculated ASCVD 10 yr risk score = low, age < 40  Plan: 1. Briefly reviewed ASCVD risk in general 2. Encourage improved lifestyle - low carb/cholesterol, reduce portion size, start regular exercise 3. Follow-up yearly lipids

## 2017-07-05 NOTE — Assessment & Plan Note (Addendum)
Clinically consistent with early mild R trigger finger index/middle with stiffness and some difficulty extending if flexed Secondary to repetitive work, often has grip for long time, has wear and callus formation on palmar aspect R handed  1. Reassurance likely early problem, may be related to possible underlying OA/DJD 2. May take Tylenol PRN, other conservative therapy ice/heat, compression, rest, avoid injury 3. No need for x-rays 4. Follow-up as needed

## 2017-07-05 NOTE — Assessment & Plan Note (Signed)
Uncertain exact etiology, mild elevation isolated ALT. Comparison labs 3 years ago with normal LFTs. No alcohol consumption. Possibly secondary to mild elevated LDL cholesterol vs genetic predisposition fatty liver, also with elevated BMI >30 and limited regular exercise  Plan: 1. Reassurance 2. Improve lifestyle regular exercise 3. Follow-up yearly lab with LFTs in 06/2018 for trend 4. If still elevated would consider RUQ abdominal US for liver eval

## 2017-07-05 NOTE — Assessment & Plan Note (Signed)
Stable, well controlled on PPI No GI red flags - known complication with Barrett's esophagus followed by Dr [facility] last EGD 12/2014 Improved symptoms if continues to avoid food triggers Continue OTC Nexium 20mg  daily, notify office if need rx Follow-up as planned with [facility] likely in 12/2017

## 2017-07-05 NOTE — Assessment & Plan Note (Signed)
Stable, on chronic PPI esomeprazole 20mg  daily OTC Followed by Dr [facility] in past, last EGD 12/2014 with dx Barrett's - Avoid triggers - Follow-up as planned with GI for repeat EGD surveillance likely soonest within 3 years, anticipated 12/2017, recommended that he contact Dr [facility] for scheduling, and if need referral we can provide

## 2017-07-05 NOTE — Assessment & Plan Note (Signed)
Presumed clinical diagnosis with multiple joint issues, mostly minor. No x-ray imaging to confirm OA/DJD History of R shoulder bursitis, low back pain, and intermittent L>R knee aches, also R hand stiffness possible trigger finger  Plan: 1. Reassurance overall likely mild early OA/DJD 2. May take Tylenol PRN, other conservative therapy ice/heat, compression, rest, avoid injury 3. Future may consider X-rays if need, possibly knees if worse 4. Follow-up as needed

## 2017-07-11 ENCOUNTER — Telehealth: Payer: Self-pay | Admitting: Family Medicine

## 2017-07-11 ENCOUNTER — Other Ambulatory Visit: Payer: Self-pay | Admitting: Family Medicine

## 2017-07-11 ENCOUNTER — Other Ambulatory Visit: Payer: Self-pay

## 2017-07-11 DIAGNOSIS — K219 Gastro-esophageal reflux disease without esophagitis: Secondary | ICD-10-CM

## 2017-07-11 MED ORDER — ESOMEPRAZOLE MAGNESIUM 20 MG PO CPDR: 20.0000 mg | DELAYED_RELEASE_CAPSULE | Freq: Every day | ORAL | 3 refills | Status: DC

## 2017-07-11 NOTE — Telephone Encounter (Signed)
Pt needs a ref[facility] on [facility].

## 2017-07-11 NOTE — Telephone Encounter (Signed)
[facility] to [facility]

## 2017-07-12 ENCOUNTER — Telehealth: Payer: Self-pay | Admitting: Family Medicine

## 2017-07-12 ENCOUNTER — Other Ambulatory Visit: Payer: Self-pay | Admitting: Family Medicine

## 2017-07-12 DIAGNOSIS — K219 Gastro-esophageal reflux disease without esophagitis: Secondary | ICD-10-CM

## 2017-07-12 MED ORDER — ESOMEPRAZOLE MAGNESIUM 40 MG PO CPDR: 40.0000 mg | DELAYED_RELEASE_CAPSULE | Freq: Every day | ORAL | 3 refills | Status: DC

## 2017-07-12 NOTE — Telephone Encounter (Signed)
40 mg Rx send.

## 2017-07-12 NOTE — Telephone Encounter (Signed)
Pt. Called states went to  Pharmacy to  Pick up meds it was incorrect need to be  Nexium  40 mg. Pt. Call back  # is [phone].

## 2017-08-15 ENCOUNTER — Ambulatory Visit (INDEPENDENT_AMBULATORY_CARE_PROVIDER_SITE_OTHER): Payer: 59 | Admitting: Family Medicine

## 2017-08-15 ENCOUNTER — Encounter: Payer: Self-pay | Admitting: Family Medicine

## 2017-08-15 ENCOUNTER — Other Ambulatory Visit: Payer: Self-pay

## 2017-08-15 VITALS — BP 131/65 | HR 52 | Temp 98.5°F | Ht 70.0 in | Wt 205.2 lb

## 2017-08-15 DIAGNOSIS — B029 Zoster without complications: Secondary | ICD-10-CM

## 2017-08-15 DIAGNOSIS — Z8619 Personal history of other infectious and parasitic diseases: Secondary | ICD-10-CM | POA: Insufficient documentation

## 2017-08-15 MED ORDER — GABAPENTIN 100 MG PO CAPS: ORAL_CAPSULE | ORAL | 0 refills | Status: DC

## 2017-08-15 MED ORDER — VALACYCLOVIR HCL 1 G PO TABS: 1000.0000 mg | ORAL_TABLET | Freq: Three times a day (TID) | ORAL | 0 refills | Status: DC

## 2017-08-15 NOTE — Assessment & Plan Note (Addendum)
Clinically consistent with new acute shingles herpes zoster outbreak onset 2 days ago, without neuropathic pain but with mild itching, and some extension L mid abdomen flank - Trigger stress with work - Not indicated age for zostavax - No evidence of complications, not involving face or cranial nerves  Plan: 1. Start Valacyclovir 1000mg  TID with food x 7 days, counseled on treatment course and side effects 2. Discussed additional neuropathic pain med options such as gabapentin - temporary rx given for short term if worsening - future hold off on other meds such as TCA, Tramadol PRN 3. Counseling on contact and spread 4. Follow-up as needed within 4-6 weeks if not resolving or if residual pain, strict return criteria if ocular involvement or other acute concerns

## 2017-08-15 NOTE — Patient Instructions (Addendum)
Thank you for coming to the clinic today.  1. Most consistent with shingles rash - Start with Valacyclovir 3 times daily for 7 days, can extend - Also rx gabapentin if experiencing worse nerve pain or discomfort - take at night for 2-3 nights only, and then increase to 2 times a day for a few days, and then may increase to 3 times a day, it may make you drowsy, if helps significantly at night only, then you can increase instead to 3 capsules at night, instead of 3 times a day  Please schedule a Follow-up Appointment to: Return in about 2 weeks (around 08/29/2017), or if symptoms worsen or fail to improve, for shingles if not improved.  If you have any other questions or concerns, please feel free to call the clinic or send a message through MyChart. You may also schedule an earlier appointment if necessary.  Additionally, you may be receiving a survey about your experience at our clinic within a few days to 1 week by e-mail or mail. We value your feedback.  Saralyn PilarAlexander Mikayela Deats, DO [facility], [facility]    Shingles Shingles, which is also known as herpes zoster, is an infection that causes a painful skin rash and fluid-filled blisters. Shingles is not related to genital herpes, which is a sexually transmitted infection. Shingles only develops in people who:  Have had chickenpox.  Have received the chickenpox vaccine. (This is rare.)  What are the causes? Shingles is caused by varicella-zoster virus (VZV). This is the same virus that causes chickenpox. After exposure to VZV, the virus stays in the body in an inactive (dormant) state. Shingles develops if the virus reactivates. This can happen many years after the initial exposure to VZV. It is not known what causes this virus to reactivate. What increases the risk? People who have had chickenpox or received the chickenpox vaccine are at risk for shingles. Infection is more common in people who:  Are older than age  37.  Have a weakened defense (immune) system, such as those with HIV, AIDS, or cancer.  Are taking medicines that weaken the immune system, such as transplant medicines.  Are under great stress.  What are the signs or symptoms? Early symptoms of this condition include itching, tingling, and pain in an area on your skin. Pain may be described as burning, stabbing, or throbbing. A few days or weeks after symptoms start, a painful red rash appears, usually on one side of the body in a bandlike or beltlike pattern. The rash eventually turns into fluid-filled blisters that break open, scab over, and dry up in about 2-3 weeks. At any time during the infection, you may also develop:  A fever.  Chills.  A headache.  An upset stomach.  How is this diagnosed? This condition is diagnosed with a skin exam. Sometimes, skin or fluid samples are taken from the blisters before a diagnosis is made. These samples are examined under a microscope or sent to a lab for testing. How is this treated? There is no specific cure for this condition. Your health care provider will probably prescribe medicines to help you manage pain, recover more quickly, and avoid long-term problems. Medicines may include:  Antiviral drugs.  Anti-inflammatory drugs.  Pain medicines.  If the area involved is on your face, you may be referred to a specialist, such as an eye doctor (ophthalmologist) or an ear, nose, and throat (ENT) doctor to help you avoid eye problems, chronic pain, or disability. Follow  these instructions at home: Medicines  Take medicines only as directed by your health care provider.  Apply an anti-itch or numbing cream to the affected area as directed by your health care provider. Blister and Rash Care  Take a cool bath or apply cool compresses to the area of the rash or blisters as directed by your health care provider. This may help with pain and itching.  Keep your rash covered with a loose  bandage (dressing). Wear loose-fitting clothing to help ease the pain of material rubbing against the rash.  Keep your rash and blisters clean with mild soap and cool water or as directed by your health care provider.  Check your rash every day for signs of infection. These include redness, swelling, and pain that lasts or increases.  Do not pick your blisters.  Do not scratch your rash. General instructions  Rest as directed by your health care provider.  Keep all follow-up visits as directed by your health care provider. This is important.  Until your blisters scab over, your infection can cause chickenpox in people who have never had it or been vaccinated against it. To prevent this from happening, avoid contact with other people, especially: ? Babies. ? Pregnant women. ? Children who have eczema. ? Elderly people who have transplants. ? People who have chronic illnesses, such as leukemia or AIDS. Contact a health care provider if:  Your pain is not relieved with prescribed medicines.  Your pain does not get better after the rash heals.  Your rash looks infected. Signs of infection include redness, swelling, and pain that lasts or increases. Get help right away if:  The rash is on your face or nose.  You have facial pain, pain around your eye area, or loss of feeling on one side of your face.  You have ear pain or you have ringing in your ear.  You have loss of taste.  Your condition gets worse. This information is not intended to replace advice given to you by your health care provider. Make sure you discuss any questions you have with your health care provider. Document Released: 08/27/2005 Document Revised: 04/22/2016 Document Reviewed: 07/08/2014 Elsevier Interactive Patient Education  2017 [employer]

## 2017-08-15 NOTE — Progress Notes (Signed)
Subjective:    Patient ID: Jay SineMatthew Nasca, male    DOB: 08/13/1981, 36 y.o.   MRN: [id number]  Jay Buchanan is a 36 y.o. male presenting on 08/15/2017 for Herpes Zoster (possible shingles on the left side of the flank x 1 day )  Patient presents for a same day appointment. Accompanied by wife, Jay Sheldonshley who provides additional history.  HPI   SHINGLES, Acute, Left Abdomen / Flank Reports symptoms started 2 days ago, with tingling initially left abdomen and flank for several hours prior to onset rash with redness and vesicular patchy appearance, started towards midline and extended across abdomen, now some new lesions today in same pattern towards back on L side in this region. Describes itching otherwise minimal symptoms. - History of chicken pox before as child. He is too young for shingles vaccine. 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 Also recent "run down" illness with URI symptoms cold cough laryngitis and feeling tired for 3-4 days prior to symptoms. - He has a young daughter who has received x 1 chicken pox vaccine, next is at age 174 yr - Denies fevers/chills, painful rash, extending rash in other area erythema, drainage of pus, nausea vomiting  [facility] 2/9 07/05/2017 04/05/2017  Decreased Interest 0 0  Down, Depressed, Hopeless 0 0  PHQ - 2 Score 0 0    Social History   Tobacco Use  . Smoking status: Never Smoker  . Smokeless tobacco: Never Used  Substance Use Topics  . Alcohol use: No  . Drug use: No    Review of Systems Per HPI unless specifically indicated above     Objective:    BP 131/65 (BP Location: Right Arm, Patient Position: Sitting, Cuff Size: Normal)   Pulse (!) 52   Temp 98.5 F (36.9 C) (Oral)   Ht 5\' 10"  (1.778 m)   Wt 205 lb 3.2 oz (93.1 kg)   BMI  29.44 kg/m   Wt Readings from Last 3 Encounters:  08/15/17 205 lb 3.2 oz (93.1 kg)  07/05/17 212 lb (96.2 kg)  04/05/17 209 lb (94.8 kg)    Physical Exam  Constitutional: He is oriented to person, place, and time. He appears well-developed and well-nourished. No distress.  Well-appearing, comfortable, cooperative  HENT:  Head: Normocephalic and atraumatic.  Mouth/Throat: Oropharynx is clear and moist.  Eyes: Conjunctivae are normal. Right eye exhibits no discharge. Left eye exhibits no discharge.  Cardiovascular: Normal rate and intact distal pulses.  Pulmonary/Chest: Effort normal.  Musculoskeletal: He exhibits no edema.  Neurological: He is alert and oriented to person, place, and time.  Skin: Skin is warm and dry. Rash (Left anterior abdomen mid across with raised patchy vesicular and erythematous rash, no oozing, drainage of pus - extending across in dermatomal pattern to Left flank and low back not crossing midline, non tender to touch) noted. He is not diaphoretic. No erythema.  Psychiatric: He has a normal mood and affect. His behavior is normal.  Well groomed, good eye contact, normal speech and thoughts  Nursing note and vitals reviewed.         Results for orders placed or performed in visit on 06/27/17  COMPLETE METABOLIC PANEL WITH GFR  Result Value Ref Range   Glucose, Bld 96 65 - 99 mg/dL   BUN 17  7 - 25 mg/dL   Creat 1.61 0.96 - 0.45 mg/dL   GFR, Est Non African American 107 > OR = 60 mL/min/1.66m2   GFR, Est African American 124 > OR = 60 mL/min/1.28m2   BUN/Creatinine Ratio NOT APPLICABLE 6 - 22 (calc)   Sodium 137 135 - 146 mmol/L   Potassium 4.3 3.5 - 5.3 mmol/L   Chloride 101 98 - 110 mmol/L   CO2 28 20 - 32 mmol/L   Calcium 8.9 8.6 - 10.3 mg/dL   Total Protein 6.7 6.1 - 8.1 g/dL   Albumin 4.2 3.6 - 5.1 g/dL   Globulin 2.5 1.9 - 3.7 g/dL (calc)   AG Ratio 1.7 1.0 - 2.5 (calc)   Total Bilirubin 1.0 0.2 - 1.2 mg/dL   Alkaline phosphatase (APISO) 129  (H) 40 - 115 U/L   AST 31 10 - 40 U/L   ALT 61 (H) 9 - 46 U/L  CBC with Differential/Platelet  Result Value Ref Range   WBC 6.4 3.8 - 10.8 Thousand/uL   RBC 5.01 4.20 - 5.80 Million/uL   Hemoglobin 14.8 13.2 - 17.1 g/dL   HCT 40.9 81.1 - 91.4 %   MCV 82.6 80.0 - 100.0 fL   MCH 29.5 27.0 - 33.0 pg   MCHC 35.7 32.0 - 36.0 g/dL   RDW 78.2 95.6 - 21.3 %   Platelets 260 140 - 400 Thousand/uL   MPV 10.2 7.5 - 12.5 fL   Neutro Abs 3,155 1,500 - 7,800 cells/uL   Lymphs Abs 1,882 850 - 3,900 cells/uL   WBC mixed population 832 200 - 950 cells/uL   Eosinophils Absolute 480 15 - 500 cells/uL   Basophils Absolute 51 0 - 200 cells/uL   Neutrophils Relative % 49.3 %   Total Lymphocyte 29.4 %   Monocytes Relative 13.0 %   Eosinophils Relative 7.5 %   Basophils Relative 0.8 %  Lipid panel  Result Value Ref Range   Cholesterol 192 <200 mg/dL   HDL 62 >08 mg/dL   Triglycerides 49 <657 mg/dL   LDL Cholesterol (Calc) 115 (H) mg/dL (calc)   Total CHOL/HDL Ratio 3.1 <5.0 (calc)   Non-HDL Cholesterol (Calc) 130 (H) <130 mg/dL (calc)  Hemoglobin Q4O  Result Value Ref Range   Hgb A1c MFr Bld 5.3 <5.7 % of total Hgb   Mean Plasma Glucose 105 (calc)   eAG (mmol/L) 5.8 (calc)      Assessment & Plan:   Problem List Items Addressed This Visit    Herpes zoster without complication - Primary    Clinically consistent with new acute shingles herpes zoster outbreak onset 2 days ago, without neuropathic pain but with mild itching, and some extension L mid abdomen flank - Trigger stress with work and recent URI illness - Not indicated age for zostavax - No evidence of complications, not involving face or cranial nerves  Plan: 1. Start Valacyclovir 1000mg  TID with food x 7 days, counseled on treatment course and side effects 2. Discussed additional neuropathic pain med options such as gabapentin - temporary rx given for short term if worsening - future hold off on other meds such as TCA, Tramadol  PRN 3. Counseling on contact and spread 4. Follow-up as needed within 4-6 weeks if not resolving or if residual pain, strict return criteria if ocular involvement or other acute concerns       Relevant Medications   valACYclovir (VALTREX) 1000 MG tablet   gabapentin (NEURONTIN) 100 MG capsule  Meds ordered this encounter  Medications  . valACYclovir (VALTREX) 1000 MG tablet    Sig: Take 1 tablet (1,000 mg total) by mouth 3 (three) times daily. For shingles    Dispense:  21 tablet    Refill:  0  . gabapentin (NEURONTIN) 100 MG capsule    Sig: Start 1 capsule daily, increase by 1 cap every 2-3 days as tolerated up to 3 times a day, or may take 3 at once in evening.    Dispense:  30 capsule    Refill:  0    Follow up plan: Return in about 2 weeks (around 08/29/2017), or if symptoms worsen or fail to improve, for shingles if not improved.  Saralyn PilarAlexander Georgi Navarrete, DO [facility] 08/15/2017, 9:24 AM

## 2017-11-01 ENCOUNTER — Other Ambulatory Visit: Payer: Self-pay | Admitting: Nurse Practitioner

## 2017-11-01 DIAGNOSIS — Z20828 Contact with and (suspected) exposure to other viral communicable diseases: Secondary | ICD-10-CM

## 2017-11-01 MED ORDER — OSELTAMIVIR PHOSPHATE 75 MG PO CAPS: 75.0000 mg | ORAL_CAPSULE | Freq: Every day | ORAL | 0 refills | Status: AC

## 2017-11-01 NOTE — Progress Notes (Signed)
Pt started developing symptoms prior to picking up prescription. Would like to use Xofluza instead.  Sample provided to patient. Take Xofluza 40 mg tablets.  Take 2 tablets today for one dose. Lot: [id number] Expiration: 02/2019

## 2017-11-01 NOTE — Progress Notes (Signed)
Pt with flu exposure and positive influenza A test in that individual.  Start prophylactic treatment.

## 2018-04-07 ENCOUNTER — Ambulatory Visit (INDEPENDENT_AMBULATORY_CARE_PROVIDER_SITE_OTHER): Payer: No Typology Code available for payment source | Admitting: Family Medicine

## 2018-04-07 ENCOUNTER — Encounter: Payer: Self-pay | Admitting: Family Medicine

## 2018-04-07 VITALS — BP 128/77 | HR 57 | Temp 98.2°F | Resp 16 | Ht 70.0 in | Wt 208.0 lb

## 2018-04-07 DIAGNOSIS — J0100 Acute maxillary sinusitis, unspecified: Secondary | ICD-10-CM | POA: Diagnosis not present

## 2018-04-07 MED ORDER — IPRATROPIUM BROMIDE 0.06 % NA SOLN: 2.0000 | Freq: Four times a day (QID) | NASAL | 0 refills | Status: DC

## 2018-04-07 MED ORDER — AMOXICILLIN-POT CLAVULANATE 875-125 MG PO TABS: 1.0000 | ORAL_TABLET | Freq: Two times a day (BID) | ORAL | 0 refills | Status: DC

## 2018-04-07 NOTE — Progress Notes (Signed)
Subjective:    Patient ID: Jay Buchanan, male    DOB: 1981/05/09, 37 y.o.   MRN: [id number]  Kern Gingras is a 37 y.o. male presenting on 04/07/2018 for Sinus Problem (onset week, no chills or fever, chest congestion, cough, irritation in throat)   HPI   SINUSITIS, Acute Reports symptoms started approx 7/19 with more sinus congestion and pressure, and drainage and later 4-5 days worsened to develop chest congestion and productive cough and still with sinus drainage. Does not seem to be improving. Reports recent trigger outdoors on trip to mountains, exposure to camp fire outside in cold air, and sick contact - Tried OTC mucinex for past few days to week, seems to reduce cough and help clear congestion but still not resolving the problem - No history of asthma, COPD - Admits Left worse than Right sinus pain and pressure - Denies any fevers, chills, nausea vomiting, abdominal pain, rash, sore throat, ear pain  Depression screen [facility] 2/9 04/07/2018 07/05/2017 04/05/2017  Decreased Interest 0 0 0  Down, Depressed, Hopeless 0 0 0  PHQ - 2 Score 0 0 0    Social History   Tobacco Use  . Smoking status: Never Smoker  . Smokeless tobacco: Never Used  Substance Use Topics  . Alcohol use: No  . Drug use: No    Review of Systems Per HPI unless specifically indicated above     Objective:    BP 128/77   Pulse (!) 57   Temp 98.2 F (36.8 C) (Oral)   Resp 16   Ht 5\' 10"  (1.778 m)   Wt 208 lb (94.3 kg)   SpO2 99%   BMI 29.84 kg/m   Wt Readings from Last 3 Encounters:  04/07/18 208 lb (94.3 kg)  08/15/17 205 lb 3.2 oz (93.1 kg)  07/05/17 212 lb (96.2 kg)    Physical Exam  Constitutional: He is oriented to person, place, and time. He appears well-developed and well-nourished. No distress.  Mildly tired appearing and congested, comfortable, cooperative  HENT:  Head: Normocephalic and atraumatic.  Mouth/Throat: Oropharynx is clear and moist.  L>R maxillary sinuses tender. Nares  patent with some congestion without purulence. Bilateral TMs clear without erythema, effusion or bulging. Oropharynx clear without erythema, exudates, edema or asymmetry.  Eyes: Conjunctivae are normal. Right eye exhibits no discharge. Left eye exhibits no discharge.  Neck: Normal range of motion. Neck supple.  Cardiovascular: Normal rate, regular rhythm, normal heart sounds and intact distal pulses.  No murmur heard. Pulmonary/Chest: Effort normal. No respiratory distress. He has no wheezes. He has no rales.  Slightly reduced air movement with some rhonchi - improve with cough. No focal wheezing or crackles.  Musculoskeletal: Normal range of motion. He exhibits no edema.  Lymphadenopathy:    He has no cervical adenopathy.  Neurological: He is alert and oriented to person, place, and time.  Skin: Skin is warm and dry. No rash noted. He is not diaphoretic. No erythema.  Psychiatric: He has a normal mood and affect. His behavior is normal.  Well groomed, good eye contact, normal speech and thoughts  Nursing note and vitals reviewed.  Results for orders placed or performed in visit on 06/27/17  COMPLETE METABOLIC PANEL WITH GFR  Result Value Ref Range   Glucose, Bld 96 65 - 99 mg/dL   BUN 17 7 - 25 mg/dL   Creat 4.09 8.11 - 9.14 mg/dL   GFR, Est Non African American 107 > OR = 60 mL/min/1.42m2  GFR, Est African American 124 > OR = 60 mL/min/1.69m2   BUN/Creatinine Ratio NOT APPLICABLE 6 - 22 (calc)   Sodium 137 135 - 146 mmol/L   Potassium 4.3 3.5 - 5.3 mmol/L   Chloride 101 98 - 110 mmol/L   CO2 28 20 - 32 mmol/L   Calcium 8.9 8.6 - 10.3 mg/dL   Total Protein 6.7 6.1 - 8.1 g/dL   Albumin 4.2 3.6 - 5.1 g/dL   Globulin 2.5 1.9 - 3.7 g/dL (calc)   AG Ratio 1.7 1.0 - 2.5 (calc)   Total Bilirubin 1.0 0.2 - 1.2 mg/dL   Alkaline phosphatase (APISO) 129 (H) 40 - 115 U/L   AST 31 10 - 40 U/L   ALT 61 (H) 9 - 46 U/L  CBC with Differential/Platelet  Result Value Ref Range   WBC 6.4 3.8 -  10.8 Thousand/uL   RBC 5.01 4.20 - 5.80 Million/uL   Hemoglobin 14.8 13.2 - 17.1 g/dL   HCT 91.4 78.2 - 95.6 %   MCV 82.6 80.0 - 100.0 fL   MCH 29.5 27.0 - 33.0 pg   MCHC 35.7 32.0 - 36.0 g/dL   RDW 21.3 08.6 - 57.8 %   Platelets 260 140 - 400 Thousand/uL   MPV 10.2 7.5 - 12.5 fL   Neutro Abs 3,155 1,500 - 7,800 cells/uL   Lymphs Abs 1,882 850 - 3,900 cells/uL   WBC mixed population 832 200 - 950 cells/uL   Eosinophils Absolute 480 15 - 500 cells/uL   Basophils Absolute 51 0 - 200 cells/uL   Neutrophils Relative % 49.3 %   Total Lymphocyte 29.4 %   Monocytes Relative 13.0 %   Eosinophils Relative 7.5 %   Basophils Relative 0.8 %  Lipid panel  Result Value Ref Range   Cholesterol 192 <200 mg/dL   HDL 62 >46 mg/dL   Triglycerides 49 <962 mg/dL   LDL Cholesterol (Calc) 115 (H) mg/dL (calc)   Total CHOL/HDL Ratio 3.1 <5.0 (calc)   Non-HDL Cholesterol (Calc) 130 (H) <130 mg/dL (calc)  Hemoglobin X5M  Result Value Ref Range   Hgb A1c MFr Bld 5.3 <5.7 % of total Hgb   Mean Plasma Glucose 105 (calc)   eAG (mmol/L) 5.8 (calc)      Assessment & Plan:   Problem List Items Addressed This Visit    None    Visit Diagnoses    Acute non-recurrent maxillary sinusitis    -  Primary   Relevant Medications   amoxicillin-clavulanate (AUGMENTIN) 875-125 MG tablet   ipratropium (ATROVENT) 0.06 % nasal spray      Consistent with acute L >R maxillary sinusitis, likely initially viral URI vs allergic rhinitis component with worsening concern for bacterial infection.   Plan: 1. Start Augmentin 875-125mg  PO BID x 10 days 2. Start Atrovent nasal spray decongestant 2 sprays in each nostril up to 4 times daily for 7 days 3. Supportive care with nasal saline OTC, hydration - Mucinex 4. Return criteria reviewed - if worse cough consider return for CXR, may need prednisone vs albuterol if bronchospasm   Meds ordered this encounter  Medications  . amoxicillin-clavulanate (AUGMENTIN) 875-125  MG tablet    Sig: Take 1 tablet by mouth 2 (two) times daily. For 10 days    Dispense:  20 tablet    Refill:  0  . ipratropium (ATROVENT) 0.06 % nasal spray    Sig: Place 2 sprays into both nostrils 4 (four) times daily. For up to 5-7 days  then stop.    Dispense:  15 mL    Refill:  0    Follow up plan: Return in about 2 weeks (around 04/21/2018), or if symptoms worsen or fail to improve, for sinusitis.  Saralyn PilarAlexander Warwick Nick, DO [facility] 04/07/2018, 4:07 PM

## 2018-04-07 NOTE — Patient Instructions (Addendum)
Thank you for coming to the office today.  1. It sounds like you have a Sinusitis (Bacterial Infection) - this most likely started as an Upper Respiratory Virus that has settled into an infection. Allergies can also cause this. - Start Augmentin 1 pill twice daily (breakfast and dinner, with food and plenty of water) for 10 days, complete entire course, do not stop early even if feeling better - Start Atrovent nasal spray decongestant 2 sprays in each nostril up to 4 times daily for 7 days - May continue to try OTC Mucinex (or may try Mucinex-DM for cough) up to 7-10 days then stop - Recommend to using Nasal Saline spray multiple times a day to help flush out congestion and clear sinuses - Improve hydration by drinking plenty of clear fluids (water, gatorade) to reduce secretions and thin congestion - Congestion draining down throat can cause irritation. May try warm herbal tea with honey, cough drops - Can take Tylenol or Ibuprofen as needed for fevers  If worse or not improve consider prednisone if cough severe or waking you up, or possibly inhaler, lastly we can consider chest x-ray if fevers or other worsening concerns, may rule out pneumonia but I do not think this is the case at this time.  If you develop persistent fever >101F for at least 3 consecutive days, headaches with sinus pain or pressure or persistent earache, please schedule a follow-up evaluation within next few days to week.  Please schedule a Follow-up Appointment to: Return in about 2 weeks (around 04/21/2018), or if symptoms worsen or fail to improve, for sinusitis.  If you have any other questions or concerns, please feel free to call the office or send a message through MyChart. You may also schedule an earlier appointment if necessary.  Additionally, you may be receiving a survey about your experience at our office within a few days to 1 week by e-mail or mail. We value your feedback.  Saralyn PilarAlexander Newman Waren, DO [facility]

## 2018-06-24 ENCOUNTER — Ambulatory Visit (INDEPENDENT_AMBULATORY_CARE_PROVIDER_SITE_OTHER): Payer: No Typology Code available for payment source

## 2018-06-24 DIAGNOSIS — Z23 Encounter for immunization: Secondary | ICD-10-CM | POA: Diagnosis not present

## 2018-07-28 ENCOUNTER — Other Ambulatory Visit: Payer: Self-pay | Admitting: Family Medicine

## 2018-07-28 DIAGNOSIS — K219 Gastro-esophageal reflux disease without esophagitis: Secondary | ICD-10-CM

## 2018-07-29 ENCOUNTER — Other Ambulatory Visit: Payer: Self-pay | Admitting: Family Medicine

## 2018-07-29 DIAGNOSIS — K219 Gastro-esophageal reflux disease without esophagitis: Secondary | ICD-10-CM

## 2018-07-29 MED ORDER — ESOMEPRAZOLE MAGNESIUM 40 MG PO CPDR: 40.0000 mg | DELAYED_RELEASE_CAPSULE | Freq: Every day | ORAL | 3 refills | Status: DC

## 2018-09-16 ENCOUNTER — Ambulatory Visit (INDEPENDENT_AMBULATORY_CARE_PROVIDER_SITE_OTHER): Payer: No Typology Code available for payment source | Admitting: Family Medicine

## 2018-09-16 ENCOUNTER — Encounter: Payer: Self-pay | Admitting: Family Medicine

## 2018-09-16 VITALS — BP 130/78 | HR 57 | Temp 98.1°F | Resp 16 | Ht 70.0 in | Wt 212.8 lb

## 2018-09-16 DIAGNOSIS — M150 Primary generalized (osteo)arthritis: Secondary | ICD-10-CM

## 2018-09-16 DIAGNOSIS — K22719 Barrett's esophagus with dysplasia, unspecified: Secondary | ICD-10-CM

## 2018-09-16 DIAGNOSIS — R7401 Elevation of levels of liver transaminase levels: Secondary | ICD-10-CM

## 2018-09-16 DIAGNOSIS — E663 Overweight: Secondary | ICD-10-CM | POA: Insufficient documentation

## 2018-09-16 DIAGNOSIS — E669 Obesity, unspecified: Secondary | ICD-10-CM

## 2018-09-16 DIAGNOSIS — E7800 Pure hypercholesterolemia, unspecified: Secondary | ICD-10-CM | POA: Diagnosis not present

## 2018-09-16 DIAGNOSIS — K2100 Gastro-esophageal reflux disease with esophagitis, without bleeding: Secondary | ICD-10-CM

## 2018-09-16 DIAGNOSIS — M159 Polyosteoarthritis, unspecified: Secondary | ICD-10-CM

## 2018-09-16 DIAGNOSIS — R740 Nonspecific elevation of levels of transaminase and lactic acid dehydrogenase [LDH]: Secondary | ICD-10-CM

## 2018-09-16 DIAGNOSIS — Z0000 Encounter for general adult medical examination without abnormal findings: Secondary | ICD-10-CM | POA: Diagnosis not present

## 2018-09-16 NOTE — Assessment & Plan Note (Signed)
Check LFT result w/ CMET today Follow-up results, as previously reviewed likely w/ cholesterol Encourage improve lifestyle - future may consider RUQ Abd [place] if indicated

## 2018-09-16 NOTE — Patient Instructions (Addendum)
Thank you for coming to the office today.  Keep up the good work!  Continue Nexium 40mg  daily  [facility]) [address] [place], [place] [address] Main: [phone]  Labs today - stay tuned for result.  DUE for FASTING BLOOD WORK (no food or drink after midnight before the lab appointment, only water or coffee without cream/sugar on the morning of)  SCHEDULE "Lab Only" visit in the morning at the clinic for lab draw in 1 YEAR  - Make sure Lab Only appointment is at about 1 week before your next appointment, so that results will be available  For Lab Results, once available within 2-3 days of blood draw, you can can log in to MyChart online to view your results and a brief explanation. Also, we can discuss results at next follow-up visit.   Please schedule a Follow-up Appointment to: Return in about 1 year (around 09/17/2019) for Annual Physical.  If you have any other questions or concerns, please feel free to call the office or send a message through MyChart. You may also schedule an earlier appointment if necessary.  Additionally, you may be receiving a survey about your experience at our office within a few days to 1 week by e-mail or mail. We value your feedback.  Saralyn Pilar, DO [facility], [place]

## 2018-09-16 NOTE — Assessment & Plan Note (Signed)
Previously mostly controlled cholesterol except mild elevated LDL on lifestyle Last lipid panel 06/2017 Calculated ASCVD 10 yr risk score = low, age < 40  Plan: 1. Check fasting lipid panel today - stay tuned for results, previously reviewed ASCVD risk 2. Encourage improved lifestyle - low carb/cholesterol, reduce portion size, start regular exercise 3. Follow-up yearly lipids

## 2018-09-16 NOTE — Assessment & Plan Note (Signed)
Stable without flare No further back pain flare Reassurance given Follow-up as needed

## 2018-09-16 NOTE — Progress Notes (Signed)
Subjective:    Patient ID: Jay Buchanan, male    DOB: 09-Jan-1981, 38 y.o.   MRN: [id number]  Jay Buchanan is a 38 y.o. male presenting on 09/16/2018 for Annual Exam   HPI   Here for Annual Physical and Due for fasting labs today.  HYPERLIPIDEMIA / Obesity BMI >30 Lifestyle: - Reports no specificconcerns. He is fasting today for lab - Last lipid panel 06/2017, still mild elevated LDL otherwise normal - Never been on any cholesterol medication Lifestyle - Weight gain 4-5 lbs in 6 months, he admits some fluctuation of weight - Diet: Improving diet, mostly balanced, working on drinking more water - Exercise: Limited exercise, admits needs to improve, he keeps track of steps often up to 30k, sometimes only "activity" otherwise measures more cardio exercise at times  GERD / Barrett's Esophagus - Previously followed by Dr [facility], had EGD and Colonoscopy on 12/13/14, see chart for details, review with EGD for GERD and dx with esophagitis and found to have Barrett's esophagus. - He is doing very well on Nexium 40mg  daily, without any breakthrough symptoms - Rarely will have a mild flare with symptoms of heartburn w/ tomatoes or other trigger but this is very rare and may no longer even happen - He will need future referral back to [facility] for repeat EGD for surveillance, last done 4/16 - states he may be interested to return in Spring 2020  Additional history Currently working at [employer] in [place], he changed locations due to work stress environment now improved  Health Maintenance: Last TDap approx 2011, before son was born for pertussis coverage  Colon CA Screening: Last Colonoscopy (done by Dr Servando SnareWohl [facility] [place]), results with no polyps, good for likely up to 10 years. Currently asymptomatic. No known family history of colon CA.  Prostate CA Screening: No prior prostate CA screening. Currently asymptomatic. Known family history of prostate CA w/ father age 39-60s. Due for screening  approximately at age 38 as discussed previously, sooner if symptoms.   [facility] 09/16/2018 04/07/2018 07/05/2017  Decreased Interest 0 0 0  Down, Depressed, Hopeless 0 0 0  PHQ - 2 Score 0 0 0    Past Medical History:  Diagnosis Date  . Barrett's esophagus   . GERD (gastroesophageal reflux disease)   . Heart murmur   . Hyperlipidemia    History reviewed. No pertinent surgical history. Social History   Socioeconomic History  . Marital status: Married    Spouse name: Oneal Groutshley Rizzi  . Number of children: 2  . Years of education: College  . Highest education level: Not on file  Occupational History  . 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 Financial resource strain: Not on file  . Food insecurity:    Worry: Not on file    Inability: Not on file  . Transportation needs:    Medical: Not on file    Non-medical: Not on file  Tobacco Use  . Smoking status: Never Smoker  . Smokeless tobacco: Never Used  Substance and Sexual Activity  . Alcohol use: No  . Drug use: No  . Sexual activity: Not on file  Lifestyle  . Physical activity:    Days per week: Not on file    Minutes per session: Not on file  . Stress: Not on file  Relationships  . Social connections:    Talks on phone: Not on file  Gets together: Not on file    Attends religious service: Not on file    Active member of club or organization: Not on file    Attends meetings of clubs or organizations: Not on file    Relationship status: Not on file  . Intimate partner violence:    Fear of current or ex partner: Not on file    Emotionally abused: Not on file    Physically abused: Not on file    Forced sexual activity: Not on file  Other Topics Concern  . Not on file  Social History Narrative  . Not on file   Family History  Problem Relation Age of Onset  . Diabetes Father   . Cancer Father        skin  . Hypertension Father   .  Prostate cancer Father        late 91s early 64s  . Barrett's esophagus Father   . Heart disease Father   . Lung cancer Maternal Grandfather   . Bladder Cancer Maternal Grandfather    Current Outpatient Medications on File Prior to Visit  Medication Sig  . esomeprazole (NEXIUM) 40 MG capsule Take 1 capsule (40 mg total) by mouth daily.   No current facility-administered medications on file prior to visit.     Review of Systems  Constitutional: Negative for activity change, appetite change, chills, diaphoresis, fatigue and fever.  HENT: Negative for congestion and hearing loss.   Eyes: Negative for visual disturbance.  Respiratory: Negative for apnea, cough, chest tightness, shortness of breath and wheezing.   Cardiovascular: Negative for chest pain, palpitations and leg swelling.  Gastrointestinal: Negative for abdominal pain, anal bleeding, blood in stool, constipation, diarrhea, nausea and vomiting.  Endocrine: Negative for cold intolerance.  Genitourinary: Negative for decreased urine volume, difficulty urinating, dysuria, frequency, hematuria and urgency.  Musculoskeletal: Negative for arthralgias, back pain and neck pain.  Skin: Negative for rash.  Allergic/Immunologic: Negative for environmental allergies.  Neurological: Negative for dizziness, weakness, light-headedness, numbness and headaches.  Hematological: Negative for adenopathy.  Psychiatric/Behavioral: Negative for behavioral problems, dysphoric mood and sleep disturbance. The patient is not nervous/anxious.    Per HPI unless specifically indicated above     Objective:    BP 130/78   Pulse (!) 57   Temp 98.1 F (36.7 C) (Oral)   Resp 16   Ht 5\' 10"  (1.778 m)   Wt 212 lb 12.8 oz (96.5 kg)   BMI 30.53 kg/m   Wt Readings from Last 3 Encounters:  09/16/18 212 lb 12.8 oz (96.5 kg)  04/07/18 208 lb (94.3 kg)  08/15/17 205 lb 3.2 oz (93.1 kg)    Physical Exam Vitals signs and nursing note reviewed.    Constitutional:      General: He is not in acute distress.    Appearance: He is well-developed. He is not diaphoretic.     Comments: Well-appearing, comfortable, cooperative  HENT:     Head: Normocephalic and atraumatic.     Comments: Frontal / maxillary sinuses non-tender. Nares patent without purulence or edema. Bilateral TMs clear without erythema, effusion or bulging. Oropharynx clear without erythema, exudates, edema or asymmetry. Eyes:     General:        Right eye: No discharge.        Left eye: No discharge.     Conjunctiva/sclera: Conjunctivae normal.     Pupils: Pupils are equal, round, and reactive to light.  Neck:     Musculoskeletal: Normal range  of motion and neck supple.     Thyroid: No thyromegaly.  Cardiovascular:     Rate and Rhythm: Normal rate and regular rhythm.     Heart sounds: Normal heart sounds. No murmur.  Pulmonary:     Effort: Pulmonary effort is normal. No respiratory distress.     Breath sounds: Normal breath sounds. No wheezing or rales.  Abdominal:     General: Bowel sounds are normal. There is no distension.     Palpations: Abdomen is soft. There is no mass.     Tenderness: There is no abdominal tenderness.  Musculoskeletal: Normal range of motion.        General: No tenderness.     Comments: Upper / Lower Extremities: - Normal muscle tone, strength bilateral upper extremities 5/5, lower extremities 5/5  Lymphadenopathy:     Cervical: No cervical adenopathy.  Skin:    General: Skin is warm and dry.     Findings: No erythema or rash.  Neurological:     Mental Status: He is alert and oriented to person, place, and time.     Comments: Distal sensation intact to light touch all extremities  Psychiatric:        Behavior: Behavior normal.     Comments: Well groomed, good eye contact, normal speech and thoughts    Results for orders placed or performed in visit on 06/27/17  COMPLETE METABOLIC PANEL WITH GFR  Result Value Ref Range   Glucose,  Bld 96 65 - 99 mg/dL   BUN 17 7 - 25 mg/dL   Creat 4.49 6.75 - 9.16 mg/dL   GFR, Est Non African American 107 > OR = 60 mL/min/1.51m2   GFR, Est African American 124 > OR = 60 mL/min/1.47m2   BUN/Creatinine Ratio NOT APPLICABLE 6 - 22 (calc)   Sodium 137 135 - 146 mmol/L   Potassium 4.3 3.5 - 5.3 mmol/L   Chloride 101 98 - 110 mmol/L   CO2 28 20 - 32 mmol/L   Calcium 8.9 8.6 - 10.3 mg/dL   Total Protein 6.7 6.1 - 8.1 g/dL   Albumin 4.2 3.6 - 5.1 g/dL   Globulin 2.5 1.9 - 3.7 g/dL (calc)   AG Ratio 1.7 1.0 - 2.5 (calc)   Total Bilirubin 1.0 0.2 - 1.2 mg/dL   Alkaline phosphatase (APISO) 129 (H) 40 - 115 U/L   AST 31 10 - 40 U/L   ALT 61 (H) 9 - 46 U/L  CBC with Differential/Platelet  Result Value Ref Range   WBC 6.4 3.8 - 10.8 Thousand/uL   RBC 5.01 4.20 - 5.80 Million/uL   Hemoglobin 14.8 13.2 - 17.1 g/dL   HCT 38.4 66.5 - 99.3 %   MCV 82.6 80.0 - 100.0 fL   MCH 29.5 27.0 - 33.0 pg   MCHC 35.7 32.0 - 36.0 g/dL   RDW 57.0 17.7 - 93.9 %   Platelets 260 140 - 400 Thousand/uL   MPV 10.2 7.5 - 12.5 fL   Neutro Abs 3,155 1,500 - 7,800 cells/uL   Lymphs Abs 1,882 850 - 3,900 cells/uL   WBC mixed population 832 200 - 950 cells/uL   Eosinophils Absolute 480 15 - 500 cells/uL   Basophils Absolute 51 0 - 200 cells/uL   Neutrophils Relative % 49.3 %   Total Lymphocyte 29.4 %   Monocytes Relative 13.0 %   Eosinophils Relative 7.5 %   Basophils Relative 0.8 %  Lipid panel  Result Value Ref Range   Cholesterol 192 <  200 mg/dL   HDL 62 >45>40 mg/dL   Triglycerides 49 <409<150 mg/dL   LDL Cholesterol (Calc) 115 (H) mg/dL (calc)   Total CHOL/HDL Ratio 3.1 <5.0 (calc)   Non-HDL Cholesterol (Calc) 130 (H) <130 mg/dL (calc)  Hemoglobin W1XA1c  Result Value Ref Range   Hgb A1c MFr Bld 5.3 <5.7 % of total Hgb   Mean Plasma Glucose 105 (calc)   eAG (mmol/L) 5.8 (calc)      Assessment & Plan:   Problem List Items Addressed This Visit    Barrett's esophagus    See A&P for GERD - Barrett's on  EGD 4/16 Dr [facility]ohl [facility] - Continue on PPI Protonix 40mg  - Will return to [facility] for surveillance EGD in Spring 2020      Elevated ALT measurement    Check LFT result w/ CMET today Follow-up results, as previously reviewed likely w/ cholesterol Encourage improve lifestyle - future may consider RUQ Abd US if indicated      Relevant Orders   COMPLETE METABOLIC PANEL WITH GFR   GERD (gastroesophageal reflux disease)    Stable, well controlled on PPI without breakthrough symptoms No GI red flags - known complication with Barrett's esophagus followed by Dr [facility] last EGD 12/2014  Plan Continue higher dose PPI w/ Nexium 40mg  daily rx Avoid food triggers and improve diet WIll plan to refer back to [facility] [place] Dr Servando SnareWohl for EGD surveillance later this year, likely Spring 2020, approx 4 years from last EGD, he will determine future follow-up and adjust PPI if needed      Hyperlipidemia    Previously mostly controlled cholesterol except mild elevated LDL on lifestyle Last lipid panel 06/2017 Calculated ASCVD 10 yr risk score = low, age < 40  Plan: 1. Check fasting lipid panel today - stay tuned for results, previously reviewed ASCVD risk 2. Encourage improved lifestyle - low carb/cholesterol, reduce portion size, start regular exercise 3. Follow-up yearly lipids      Relevant Orders   Lipid panel   Obesity (BMI 30.0-34.9)    Weight slightly increased up to 4-5 lbs in 6 months Attributed to lifestyle diet and reduced regular exercise Counseling on improving lifestyle Follow-up lab results      Osteoarthritis of multiple joints    Stable without flare No further back pain flare Reassurance given Follow-up as needed      Relevant Orders   CBC with Differential/Platelet   COMPLETE METABOLIC PANEL WITH GFR    Other Visit Diagnoses    Annual physical exam    -  Primary   Relevant Orders   Hemoglobin A1c   CBC with Differential/Platelet   COMPLETE METABOLIC PANEL WITH GFR   Lipid  panel      Updated Health Maintenance information Orders placed for fasting labs today - follow results w/ patient Encouraged improvement to lifestyle with diet and exercise - Goal of weight loss   No orders of the defined types were placed in this encounter.   Follow up plan: Return in about 1 year (around 09/17/2019) for Annual Physical.  Saralyn PilarAlexander Jarquis Walker, DO [facility] 09/16/2018, 9:06 AM

## 2018-09-16 NOTE — Assessment & Plan Note (Signed)
See A&P for GERD - Barrett's on EGD 4/16 Dr Servando Snare [facility] - Continue on PPI Protonix 40mg  - Will return to [facility] for surveillance EGD in Spring 2020

## 2018-09-16 NOTE — Assessment & Plan Note (Signed)
Weight slightly increased up to 4-5 lbs in 6 months Attributed to lifestyle diet and reduced regular exercise Counseling on improving lifestyle Follow-up lab results

## 2018-09-16 NOTE — Assessment & Plan Note (Signed)
Stable, well controlled on PPI without breakthrough symptoms No GI red flags - known complication with Barrett's esophagus followed by Dr [facility] last EGD 12/2014  Plan Continue higher dose PPI w/ Nexium 40mg  daily rx Avoid food triggers and improve diet WIll plan to refer back to [facility] [place] Dr Servando SnareWohl for EGD surveillance later this year, likely Spring 2020, approx 4 years from last EGD, he will determine future follow-up and adjust PPI if needed

## 2018-09-17 LAB — COMPLETE METABOLIC PANEL WITH GFR
AG Ratio: 1.7 (calc) (ref 1.0–2.5)
ALBUMIN MSPROF: 4.2 g/dL (ref 3.6–5.1)
ALT: 55 U/L — ABNORMAL HIGH (ref 9–46)
AST: 28 U/L (ref 10–40)
Alkaline phosphatase (APISO): 138 U/L — ABNORMAL HIGH (ref 40–115)
BUN: 23 mg/dL (ref 7–25)
CO2: 25 mmol/L (ref 20–32)
Calcium: 9 mg/dL (ref 8.6–10.3)
Chloride: 105 mmol/L (ref 98–110)
Creat: 0.81 mg/dL (ref 0.60–1.35)
GFR, EST AFRICAN AMERICAN: 131 mL/min/{1.73_m2} (ref >=60)
GFR, EST NON AFRICAN AMERICAN: 113 mL/min/{1.73_m2} (ref >=60)
GLUCOSE: 103 mg/dL — AB (ref 65–99)
Globulin: 2.5 g/dL (calc) (ref 1.9–3.7)
Potassium: 4.2 mmol/L (ref 3.5–5.3)
SODIUM: 140 mmol/L (ref 135–146)
TOTAL PROTEIN: 6.7 g/dL (ref 6.1–8.1)
Total Bilirubin: 0.7 mg/dL (ref 0.2–1.2)

## 2018-09-17 LAB — LIPID PANEL
CHOLESTEROL: 200 mg/dL — AB (ref <200)
HDL: 56 mg/dL (ref >40)
LDL CHOLESTEROL (CALC): 128 mg/dL — AB
Non-HDL Cholesterol (Calc): 144 mg/dL (calc) — ABNORMAL HIGH (ref <130)
Total CHOL/HDL Ratio: 3.6 (calc) (ref <5.0)
Triglycerides: 66 mg/dL (ref <150)

## 2018-09-17 LAB — CBC WITH DIFFERENTIAL/PLATELET
Absolute Monocytes: 582 cells/uL (ref 200–950)
BASOS ABS: 39 {cells}/uL (ref 0–200)
Basophils Relative: 0.7 %
EOS ABS: 314 {cells}/uL (ref 15–500)
Eosinophils Relative: 5.6 %
HEMATOCRIT: 43.3 % (ref 38.5–50.0)
Hemoglobin: 14.9 g/dL (ref 13.2–17.1)
LYMPHS ABS: 1854 {cells}/uL (ref 850–3900)
MCH: 29 pg (ref 27.0–33.0)
MCHC: 34.4 g/dL (ref 32.0–36.0)
MCV: 84.2 fL (ref 80.0–100.0)
MPV: 10 fL (ref 7.5–12.5)
Monocytes Relative: 10.4 %
Neutro Abs: 2811 cells/uL (ref 1500–7800)
Neutrophils Relative %: 50.2 %
PLATELETS: 284 10*3/uL (ref 140–400)
RBC: 5.14 10*6/uL (ref 4.20–5.80)
RDW: 12.8 % (ref 11.0–15.0)
TOTAL LYMPHOCYTE: 33.1 %
WBC: 5.6 10*3/uL (ref 3.8–10.8)

## 2018-09-17 LAB — HEMOGLOBIN A1C
Hgb A1c MFr Bld: 5.4 % of total Hgb (ref <5.7)
Mean Plasma Glucose: 108 (calc)
eAG (mmol/L): 6 (calc)

## 2018-10-19 ENCOUNTER — Other Ambulatory Visit: Payer: Self-pay | Admitting: Family Medicine

## 2018-10-19 DIAGNOSIS — K2100 Gastro-esophageal reflux disease with esophagitis, without bleeding: Secondary | ICD-10-CM

## 2018-10-19 DIAGNOSIS — K22719 Barrett's esophagus with dysplasia, unspecified: Secondary | ICD-10-CM

## 2018-10-20 ENCOUNTER — Encounter: Payer: Self-pay | Admitting: *Deleted

## 2018-12-09 NOTE — Progress Notes (Signed)
Midge Minium, MD [address]  [address]  [place], [place] [address]  Main: [phone]  Fax: [phone]    Gastroenterology Consultation Virtual/Tele Visit  Referring Provider:     Saralyn Pilar * Primary Care Physician:  Smitty Cords, DO Primary Gastroenterologist:  Dr.Senaya Dicenso Servando Snare Reason for Consultation:     Reflux and a history of Barrett's esophagus        HPI:   Jay Buchanan is a 38 y.o. male referred by Dr. Althea Charon, Netta Neat, DO  for consultation & management of his reflux and history of Barrett's esophagus  Virtual Visit via Video Note Location of the patient: Home Location of provider: Office Participating persons: Patient myself and Ginger Feldpausch  I connected with Jay Buchanan on 12/09/18 at  8:30 AM EDT by a video enabled telemedicine application and verified that I am speaking with the correct person using two identifiers.   I discussed the limitations of evaluation and management by telemedicine and the availability of in person appointments. The patient expressed understanding and agreed to proceed.  History of Present Illness: This patient comes in today with a history of having an upper endoscopy by me back in April 2016.  At that time it was reported that the patient had Barrett's esophagus.  The patient has a history of heartburn.He has been maintained on Protonix 40 mg per day.He is now on Omeprazole 40mg .  No weight loss or dysphagia.  Past Medical History:  Diagnosis Date  . Barrett's esophagus   . GERD (gastroesophageal reflux disease)   . Heart murmur   . Hyperlipidemia     No past surgical history on file.  Prior to Admission medications   Medication Sig Start Date End Date Taking? Authorizing Provider  esomeprazole (NEXIUM) 40 MG capsule Take 1 capsule (40 mg total) by mouth daily. 07/29/18   Smitty Cords, DO    Family History  Problem Relation Age of Onset  . Diabetes Father   . Cancer  Father        skin  . Hypertension Father   . Prostate cancer Father        late 28s early 80s  . Barrett's esophagus Father   . Heart disease Father   . Lung cancer Maternal Grandfather   . Bladder Cancer Maternal Grandfather      Social History   Tobacco Use  . Smoking status: Never Smoker  . Smokeless tobacco: Never Used  Substance Use Topics  . Alcohol use: No  . Drug use: No    Allergies as of 12/10/2018  . (No Known Allergies)    Review of Systems:    All systems reviewed and negative except where noted in HPI.   Observations/Objective:  Labs: CBC    Component Value Date/Time   WBC 5.6 09/16/2018 0911   RBC 5.14 09/16/2018 0911   HGB 14.9 09/16/2018 0911   HCT 43.3 09/16/2018 0911   PLT 284 09/16/2018 0911   MCV 84.2 09/16/2018 0911   MCH 29.0 09/16/2018 0911   MCHC 34.4 09/16/2018 0911   RDW 12.8 09/16/2018 0911   LYMPHSABS 1,854 09/16/2018 0911   EOSABS 314 09/16/2018 0911   BASOSABS 39 09/16/2018 0911   CMP     Component Value Date/Time   NA 140 09/16/2018 0911   NA 139 04/13/2014   K 4.2 09/16/2018 0911   CL 105 09/16/2018 0911   CO2 25 09/16/2018 0911   GLUCOSE 103 (H) 09/16/2018 0911   BUN  23 09/16/2018 0911   BUN 17 04/13/2014   CREATININE 0.81 09/16/2018 0911   CALCIUM 9.0 09/16/2018 0911   PROT 6.7 09/16/2018 0911   AST 28 09/16/2018 0911   ALT 55 (H) 09/16/2018 0911   ALKPHOS 87 04/13/2014   BILITOT 0.7 09/16/2018 0911   GFRNONAA 113 09/16/2018 0911   GFRAA 131 09/16/2018 0911    Imaging Studies: No results found.  Assessment and Plan:   Assessment and Plan: Jay Buchanan is a 37 y.o. y/o male has been referred for GERD and a history of Barrett's esophagus. The patient is due for a repeat upper endoscopy.  The patient has no worry symptoms such as black stools bloody stools nausea vomiting or dysphasia.  I have discussed risks & benefits which include, but are not limited to, bleeding, infection, perforation & drug reaction.   The patient agrees with this plan & written consent will be obtained.     Follow Up Instructions:  I discussed the assessment and treatment plan with the patient. The patient was provided an opportunity to ask questions and all were answered. The patient agreed with the plan and demonstrated an understanding of the instructions.   The patient was advised to call back or seek an in-person evaluation if the symptoms worsen or if the condition fails to improve as anticipated.  I provided 11 minutes of non-face-to-face time during this encounter.   Midge Minium, MD  Speech recognition software was used to dictate the above note.

## 2018-12-10 ENCOUNTER — Other Ambulatory Visit: Payer: Self-pay

## 2018-12-10 ENCOUNTER — Ambulatory Visit (INDEPENDENT_AMBULATORY_CARE_PROVIDER_SITE_OTHER): Payer: No Typology Code available for payment source | Admitting: Gastroenterology

## 2018-12-10 ENCOUNTER — Encounter: Payer: Self-pay | Admitting: Gastroenterology

## 2018-12-10 DIAGNOSIS — K219 Gastro-esophageal reflux disease without esophagitis: Secondary | ICD-10-CM

## 2018-12-10 DIAGNOSIS — K22719 Barrett's esophagus with dysplasia, unspecified: Secondary | ICD-10-CM | POA: Diagnosis not present

## 2019-01-21 ENCOUNTER — Telehealth: Payer: Self-pay

## 2019-01-21 NOTE — Telephone Encounter (Signed)
-----   Message from Rayann Heman, CMA sent at 12/10/2018  8:46 AM EDT ----- Schedule pt for an EGD due to Barrett's esophagus. Televisit done on 12/10/18. Pt aware.

## 2019-01-21 NOTE — Telephone Encounter (Signed)
Left vm for pt to return my call to schedule EGD with Dr. Wohl. 

## 2019-05-11 ENCOUNTER — Other Ambulatory Visit: Payer: Self-pay

## 2019-05-11 DIAGNOSIS — K22719 Barrett's esophagus with dysplasia, unspecified: Secondary | ICD-10-CM

## 2019-05-11 DIAGNOSIS — K219 Gastro-esophageal reflux disease without esophagitis: Secondary | ICD-10-CM

## 2019-05-13 ENCOUNTER — Encounter: Payer: Self-pay | Admitting: *Deleted

## 2019-05-13 ENCOUNTER — Other Ambulatory Visit: Payer: Self-pay

## 2019-05-19 ENCOUNTER — Other Ambulatory Visit
Admission: RE | Admit: 2019-05-19 | Discharge: 2019-05-19 | Disposition: A | Discharge status: Home / Self Care | Payer: No Typology Code available for payment source | Source: Ambulatory Visit | Attending: Gastroenterology | Admitting: Gastroenterology

## 2019-05-19 ENCOUNTER — Other Ambulatory Visit: Payer: Self-pay

## 2019-05-19 DIAGNOSIS — Z01812 Encounter for preprocedural laboratory examination: Secondary | ICD-10-CM | POA: Insufficient documentation

## 2019-05-19 DIAGNOSIS — Z20828 Contact with and (suspected) exposure to other viral communicable diseases: Secondary | ICD-10-CM | POA: Insufficient documentation

## 2019-05-20 LAB — SARS CORONAVIRUS 2 (TAT 6-24 HRS): SARS Coronavirus 2: NEGATIVE

## 2019-05-20 NOTE — Anesthesia Preprocedure Evaluation (Addendum)
Anesthesia Evaluation  Patient identified by MRN, date of birth, ID band Patient awake    Reviewed: Allergy & Precautions, NPO status , Patient's Chart, lab work & pertinent test results  History of Anesthesia Complications Negative for: history of anesthetic complications  Airway Mallampati: IV   Neck ROM: Full    Dental no notable dental hx.    Pulmonary neg pulmonary ROS,    Pulmonary exam normal breath sounds clear to auscultation       Cardiovascular Exercise Tolerance: Good  Rhythm:Regular Rate:Normal + Systolic murmurs Murmur    Neuro/Psych negative neurological ROS     GI/Hepatic GERD (Barrett esophagus)  ,  Endo/Other  negative endocrine ROS  Renal/GU negative Renal ROS     Musculoskeletal   Abdominal   Peds  Hematology negative hematology ROS (+)   Anesthesia Other Findings   Reproductive/Obstetrics                            Anesthesia Physical Anesthesia Plan  ASA: II  Anesthesia Plan: General   Post-op Pain Management:    Induction: Intravenous  PONV Risk Score and Plan: 2 and TIVA and Propofol infusion  Airway Management Planned: Natural Airway  Additional Equipment:   Intra-op Plan:   Post-operative Plan:   Informed Consent: I have reviewed the patients History and Physical, chart, labs and discussed the procedure including the risks, benefits and alternatives for the proposed anesthesia with the patient or authorized representative who has indicated his/her understanding and acceptance.       Plan Discussed with: CRNA  Anesthesia Plan Comments:        Anesthesia Quick Evaluation

## 2019-05-20 NOTE — Discharge Instructions (Signed)

## 2019-05-21 ENCOUNTER — Other Ambulatory Visit: Payer: Self-pay

## 2019-05-21 ENCOUNTER — Encounter
Admission: RE | Disposition: A | Discharge status: Home / Self Care | Payer: Self-pay | Source: Home / Self Care | Attending: Gastroenterology

## 2019-05-21 ENCOUNTER — Ambulatory Visit: Payer: No Typology Code available for payment source | Admitting: Anesthesiology

## 2019-05-21 ENCOUNTER — Ambulatory Visit
Admission: RE | Admit: 2019-05-21 | Discharge: 2019-05-21 | Disposition: A | Discharge status: Home / Self Care | Payer: No Typology Code available for payment source | Attending: Gastroenterology | Admitting: Gastroenterology

## 2019-05-21 DIAGNOSIS — Z13810 Encounter for screening for upper gastrointestinal disorder: Secondary | ICD-10-CM | POA: Diagnosis present

## 2019-05-21 DIAGNOSIS — K219 Gastro-esophageal reflux disease without esophagitis: Secondary | ICD-10-CM | POA: Diagnosis not present

## 2019-05-21 DIAGNOSIS — Z8719 Personal history of other diseases of the digestive system: Secondary | ICD-10-CM | POA: Insufficient documentation

## 2019-05-21 DIAGNOSIS — K2270 Barrett's esophagus without dysplasia: Secondary | ICD-10-CM | POA: Diagnosis not present

## 2019-05-21 DIAGNOSIS — K2970 Gastritis, unspecified, without bleeding: Secondary | ICD-10-CM | POA: Diagnosis not present

## 2019-05-21 DIAGNOSIS — K2900 Acute gastritis without bleeding: Secondary | ICD-10-CM | POA: Diagnosis not present

## 2019-05-21 DIAGNOSIS — K449 Diaphragmatic hernia without obstruction or gangrene: Secondary | ICD-10-CM | POA: Insufficient documentation

## 2019-05-21 DIAGNOSIS — K22719 Barrett's esophagus with dysplasia, unspecified: Secondary | ICD-10-CM

## 2019-05-21 HISTORY — PX: ESOPHAGOGASTRODUODENOSCOPY (EGD) WITH PROPOFOL: SHX5813

## 2019-05-21 SURGERY — ESOPHAGOGASTRODUODENOSCOPY (EGD) WITH PROPOFOL
Anesthesia: General

## 2019-05-21 MED ORDER — SODIUM CHLORIDE 0.9 % IV SOLN: INTRAVENOUS | Status: DC

## 2019-05-21 MED ORDER — GLYCOPYRROLATE 0.2 MG/ML IJ SOLN
INTRAMUSCULAR | Status: DC | PRN
  Administered 2019-05-21: 0.2 mg via INTRAVENOUS

## 2019-05-21 MED ORDER — PROPOFOL 10 MG/ML IV BOLUS
INTRAVENOUS | Status: DC | PRN
  Administered 2019-05-21 (×2): 50 mg via INTRAVENOUS
  Administered 2019-05-21: 100 mg via INTRAVENOUS

## 2019-05-21 MED ORDER — LACTATED RINGERS IV SOLN
10.0000 mL/h | INTRAVENOUS | Status: DC
  Administered 2019-05-21: 10 mL/h via INTRAVENOUS

## 2019-05-21 MED ORDER — ACETAMINOPHEN 325 MG PO TABS: 650.0000 mg | ORAL_TABLET | Freq: Once | ORAL | Status: DC | PRN

## 2019-05-21 MED ORDER — LIDOCAINE HCL (CARDIAC) PF 100 MG/5ML IV SOSY
PREFILLED_SYRINGE | INTRAVENOUS | Status: DC | PRN
  Administered 2019-05-21: 30 mg via INTRAVENOUS

## 2019-05-21 MED ORDER — ONDANSETRON HCL 4 MG/2ML IJ SOLN: 4.0000 mg | Freq: Once | INTRAMUSCULAR | Status: DC | PRN

## 2019-05-21 MED ORDER — ACETAMINOPHEN 160 MG/5ML PO SOLN: 325.0000 mg | ORAL | Status: DC | PRN

## 2019-05-21 MED ORDER — STERILE WATER FOR IRRIGATION IR SOLN
Status: DC | PRN
  Administered 2019-05-21: .05 mL

## 2019-05-21 SURGICAL SUPPLY — 7 items
BLOCK BITE 60FR ADLT L/F GRN (MISCELLANEOUS) ×3 IMPLANT
CANISTER SUCT 1200ML W/VALVE (MISCELLANEOUS) ×3 IMPLANT
FORCEPS BIOP RAD 4 LRG CAP 4 (CUTTING FORCEPS) ×3 IMPLANT
GOWN CVR UNV OPN BCK APRN NK (MISCELLANEOUS) ×2 IMPLANT
GOWN ISOL THUMB LOOP REG UNIV (MISCELLANEOUS) ×4
KIT ENDO PROCEDURE OLY (KITS) ×3 IMPLANT
WATER STERILE IRR 250ML POUR (IV SOLUTION) ×3 IMPLANT

## 2019-05-21 NOTE — H&P (Signed)
Lucilla Lame, MD [facility] [address] [place], [place] [address] Phone:[phone] Fax : [phone]  Primary Care Physician:  Olin Hauser, DO Primary Gastroenterologist:  Dr. Allen Norris  Pre-Procedure History & Physical: HPI:  Jay Buchanan is a 38 y.o. male is here for an endoscopy.   Past Medical History:  Diagnosis Date  . Barrett's esophagus   . GERD (gastroesophageal reflux disease)   . Heart murmur   . Hyperlipidemia     Past Surgical History:  Procedure Laterality Date  . COLONOSCOPY WITH ESOPHAGOGASTRODUODENOSCOPY (EGD)  12/13/2014   Dr Allen Norris [facility]    Prior to Admission medications   Medication Sig Start Date End Date Taking? Authorizing Provider  esomeprazole (NEXIUM) 40 MG capsule Take 1 capsule (40 mg total) by mouth daily. 07/29/18  Yes Olin Hauser, DO    Allergies as of 05/11/2019  . (No Known Allergies)    Family History  Problem Relation Age of Onset  . Diabetes Father   . Cancer Father        skin  . Hypertension Father   . Prostate cancer Father        late 65s early 92s  . Barrett's esophagus Father   . Heart disease Father   . Lung cancer Maternal Grandfather   . Bladder Cancer Maternal Grandfather     Social History   Socioeconomic History  . Marital status: Married    Spouse name: Payson Evrard  . Number of children: 2  . Years of education: College  . Highest education level: Not on file  Occupational History  . 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 Financial resource strain: Not on file  . Food insecurity    Worry: Not on file    Inability: Not on file  . Transportation needs    Medical: Not on file    Non-medical: Not on file  Tobacco Use  . Smoking status: Never Smoker  . Smokeless tobacco: Never Used  Substance and Sexual Activity  . Alcohol use: No  . Drug use: No  . Sexual activity: Not on file  Lifestyle  . Physical activity     Days per week: Not on file    Minutes per session: Not on file  . Stress: Not on file  Relationships  . Social Herbalist on phone: Not on file    Gets together: Not on file    Attends religious service: Not on file    Active member of club or organization: Not on file    Attends meetings of clubs or organizations: Not on file    Relationship status: Not on file  . Intimate partner violence    Fear of current or ex partner: Not on file    Emotionally abused: Not on file    Physically abused: Not on file    Forced sexual activity: Not on file  Other Topics Concern  . Not on file  Social History Narrative  . Not on file    Review of Systems: See HPI, otherwise negative ROS  Physical Exam: Ht 5\' 10"  (1.778 m)   Wt 99.8 kg   BMI 31.57 kg/m  General:   Alert,  pleasant and cooperative in NAD Head:  Normocephalic and atraumatic. Neck:  Supple; no masses or thyromegaly. Lungs:  Clear throughout to auscultation.    Heart:  Regular rate and rhythm. Abdomen:  Soft, nontender and  nondistended. Normal bowel sounds, without guarding, and without rebound.   Neurologic:  Alert and  oriented x4;  grossly normal neurologically.  Impression/Plan: Jay Buchanan is here for an endoscopy to be performed for barrett's esophagus  Risks, benefits, limitations, and alternatives regarding  endoscopy have been reviewed with the patient.  Questions have been answered.  All parties agreeable.   Midge Miniumarren Collyns Mcquigg, MD  05/21/2019, 9:40 AM

## 2019-05-21 NOTE — Transfer of Care (Signed)
Immediate Anesthesia Transfer of Care Note  Patient: Jay Buchanan  Procedure(s) Performed: ESOPHAGOGASTRODUODENOSCOPY (EGD) WITH PROPOFOL (N/A )  Patient Location: PACU  Anesthesia Type: General  Level of Consciousness: awake, alert  and patient cooperative  Airway and Oxygen Therapy: Patient Spontanous Breathing and Patient connected to supplemental oxygen  Post-op Assessment: Post-op Vital signs reviewed, Patient's Cardiovascular Status Stable, Respiratory Function Stable, Patent Airway and No signs of Nausea or vomiting  Post-op Vital Signs: Reviewed and stable  Complications: No apparent anesthesia complications

## 2019-05-21 NOTE — Op Note (Signed)
[facility] Gastroenterology Patient Name: Jay Buchanan Procedure Date: 05/21/2019 10:07 AM MRN: [id number] Account #: [id number] Date of Birth: [birth year] Admit Type: Outpatient Age: 38 Room: [facility] OR ROOM 01 Gender: Male Note Status: Finalized Procedure:            Upper GI endoscopy Indications:          Surveillance for malignancy due to personal history of                        Barrett's esophagus Providers:            Lucilla Lame MD, MD Referring MD:         Olin Hauser (Referring MD) Medicines:            Propofol per Anesthesia Complications:        No immediate complications. Procedure:            Pre-Anesthesia Assessment:                       - Prior to the procedure, a History and Physical was                        performed, and patient medications and allergies were                        reviewed. The patient's tolerance of previous                        anesthesia was also reviewed. The risks and benefits of                        the procedure and the sedation options and risks were                        discussed with the patient. All questions were                        answered, and informed consent was obtained. Prior                        Anticoagulants: The patient has taken no previous                        anticoagulant or antiplatelet agents. ASA Grade                        Assessment: II - A patient with mild systemic disease.                        After reviewing the risks and benefits, the patient was                        deemed in satisfactory condition to undergo the                        procedure.                       After obtaining informed consent, the endoscope was  passed under direct vision. Throughout the procedure,                        the patient's blood pressure, pulse, and oxygen                        saturations were monitored continuously. The   Endosonoscope was introduced through the mouth, and                        advanced to the second part of duodenum. The upper GI                        endoscopy was accomplished without difficulty. The                        patient tolerated the procedure well. Findings:      A small hiatal hernia was present.      The Z-line was irregular and was found at the gastroesophageal junction.      Localized mild inflammation characterized by erythema was found in the       gastric antrum. Biopsies were taken with a cold forceps for histology.      The examined duodenum was normal. Impression:           - Small hiatal hernia.                       - Z-line irregular, at the gastroesophageal junction.                       - Gastritis. Biopsied.                       - Normal examined duodenum. Recommendation:       - Discharge patient to home.                       - Resume previous diet.                       - Continue present medications.                       - Await pathology results. Procedure Code(s):    --- Professional ---                       [id number], Esophagogastroduodenoscopy, flexible, transoral;                        with biopsy, single or multiple Diagnosis Code(s):    --- Professional ---                       K22.70, Barrett's esophagus without dysplasia                       K29.70, Gastritis, unspecified, without bleeding CPT copyright 2019 American Medical Association. All rights reserved. The codes documented in this report are preliminary and upon coder review may  be revised to meet current compliance requirements. Midge Miniumarren Cashmere Harmes MD, MD 05/21/2019 10:23:08 AM This report has been signed electronically. Number of Addenda: 0 Note Initiated On: 05/21/2019 10:07 AM Total Procedure Duration: 0  hours 2 minutes 54 seconds  Estimated Blood Loss: Estimated blood loss: none.      [facility]

## 2019-05-21 NOTE — Anesthesia Postprocedure Evaluation (Signed)
Anesthesia Post Note  Patient: Jay Buchanan  Procedure(s) Performed: ESOPHAGOGASTRODUODENOSCOPY (EGD) WITH PROPOFOL (N/A )  Patient location during evaluation: PACU Anesthesia Type: General Level of consciousness: awake and alert, oriented and patient cooperative Pain management: pain level controlled Vital Signs Assessment: post-procedure vital signs reviewed and stable Respiratory status: spontaneous breathing, nonlabored ventilation and respiratory function stable Cardiovascular status: blood pressure returned to baseline and stable Postop Assessment: adequate PO intake Anesthetic complications: no    Darrin Nipper

## 2019-05-21 NOTE — Anesthesia Procedure Notes (Signed)
Procedure Name: MAC Date/Time: 05/21/2019 10:15 AM Performed by: Georga Bora, CRNA Pre-anesthesia Checklist: Patient identified, Suction available, Emergency Drugs available, Patient being monitored and Timeout performed Patient Re-evaluated:Patient Re-evaluated prior to induction Oxygen Delivery Method: Nasal cannula

## 2019-05-22 ENCOUNTER — Encounter: Payer: Self-pay | Admitting: Gastroenterology

## 2019-05-25 ENCOUNTER — Encounter: Payer: Self-pay | Admitting: Gastroenterology

## 2019-07-16 ENCOUNTER — Ambulatory Visit (INDEPENDENT_AMBULATORY_CARE_PROVIDER_SITE_OTHER): Payer: No Typology Code available for payment source

## 2019-07-16 DIAGNOSIS — Z23 Encounter for immunization: Secondary | ICD-10-CM

## 2019-07-29 ENCOUNTER — Other Ambulatory Visit: Payer: Self-pay

## 2019-07-29 DIAGNOSIS — K219 Gastro-esophageal reflux disease without esophagitis: Secondary | ICD-10-CM

## 2019-07-29 MED ORDER — ESOMEPRAZOLE MAGNESIUM 40 MG PO CPDR: 40.0000 mg | DELAYED_RELEASE_CAPSULE | Freq: Every day | ORAL | 3 refills | Status: DC

## 2019-10-19 ENCOUNTER — Other Ambulatory Visit: Payer: Self-pay

## 2019-10-19 DIAGNOSIS — K219 Gastro-esophageal reflux disease without esophagitis: Secondary | ICD-10-CM

## 2019-10-19 MED ORDER — ESOMEPRAZOLE MAGNESIUM 40 MG PO CPDR: 40.0000 mg | DELAYED_RELEASE_CAPSULE | Freq: Every day | ORAL | 3 refills | Status: DC

## 2020-06-01 ENCOUNTER — Other Ambulatory Visit: Payer: Self-pay

## 2020-06-01 DIAGNOSIS — Z20822 Contact with and (suspected) exposure to covid-19: Secondary | ICD-10-CM

## 2020-06-02 LAB — NOVEL CORONAVIRUS, NAA: SARS-CoV-2, NAA: NOT DETECTED

## 2020-06-02 LAB — SARS-COV-2, NAA 2 DAY TAT

## 2020-06-30 ENCOUNTER — Encounter: Payer: No Typology Code available for payment source | Admitting: Family Medicine

## 2020-07-29 ENCOUNTER — Telehealth: Payer: Self-pay

## 2020-07-29 ENCOUNTER — Encounter: Payer: Self-pay | Admitting: Family Medicine

## 2020-07-29 ENCOUNTER — Other Ambulatory Visit: Payer: Self-pay

## 2020-07-29 ENCOUNTER — Telehealth (INDEPENDENT_AMBULATORY_CARE_PROVIDER_SITE_OTHER): Payer: Self-pay | Admitting: Family Medicine

## 2020-07-29 DIAGNOSIS — B029 Zoster without complications: Secondary | ICD-10-CM

## 2020-07-29 MED ORDER — VALACYCLOVIR HCL 1 G PO TABS: 1000.0000 mg | ORAL_TABLET | Freq: Three times a day (TID) | ORAL | 0 refills | Status: DC

## 2020-07-29 NOTE — Patient Instructions (Addendum)
1. Start Valacyclovir 1000mg  TID with food x 7 days 2. Discussed potential course of post-herpetic neuralgia symptoms - offered medication options but these were declined. He tried gabapentin last time 08/2017 and it was not very effective. Call or message if change mind.  Follow-up as needed within 4-6 weeks if not resolving or if residual pain, strict return criteria if facial or ocular involvement or other acute concerns  Future can get Shingrix vaccine >6 weeks after shingles has resolved.  Future fasting blood work recommended for physical when able to schedule.  Please schedule a Follow-up Appointment to: Return in about 3 months (around 10/29/2020) for 3 month fasting lab only then 1 week later annual physical.  If you have any other questions or concerns, please feel free to call the office or send a message through MyChart. You may also schedule an earlier appointment if necessary.  Additionally, you may be receiving a survey about your experience at our office within a few days to 1 week by e-mail or mail. We value your feedback.  10/31/2020, DO [facility], [facility]

## 2020-07-29 NOTE — Telephone Encounter (Signed)
Patient's spouse advised she preferred virtual appointment around 4:00 pm today.

## 2020-07-29 NOTE — Telephone Encounter (Signed)
Pt complains of possible another shingle outbreak on his rt upper shoulder blade that start this morning. It is red, raised, burning and very uncomfortable. Pt state it felt like it was on fire when the water hit it from his shower this morning. Please advise No appt available today.

## 2020-07-29 NOTE — Telephone Encounter (Signed)
I don't have any available apt until Weds next week.  Options I would say would be:  1. Treat it same as last time for shingles, and he can schedule to come in and see me on Weds next week  2. May be able to double book him for the 4:00pm apt today since we have a virtual at that time. Would have to let him know we may not be on time for that appointment however.  3. Lastly - He may go to Urgent Care for evaluation today if preferred prompter treatment.  Let me know  Saralyn Pilar, DO [facility] 07/29/2020, 12:20 PM

## 2020-07-29 NOTE — Progress Notes (Addendum)
Virtual Visit via Telephone The purpose of this virtual visit is to provide medical care while limiting exposure to the novel coronavirus (COVID19) for both patient and office staff.  Consent was obtained for phone visit:  Yes.   Answered questions that patient had about telehealth interaction:  Yes.   I discussed the limitations, risks, security and privacy concerns of performing an evaluation and management service by telephone. I also discussed with the patient that there may be a patient responsible charge related to this service. The patient expressed understanding and agreed to proceed.  Patient Location: Home Provider Location: [facility] (Office)  Participants in virtual visit: - Patient: Jay Buchanan - CMA: Elvina Mattes, CMA - Provider: Dr Althea Charon  ---------------------------------------------------------------------- Chief Complaint  Patient presents with  . Herpes Zoster    on the back --shoulder Right side onset today    S: Reviewed CMA documentation. I have called patient and gathered additional HPI as follows:  Possible Shingles / Rash, Right Sided rash Last episode shingles 08/2017 L abdomen flank, treated and resolved. No vaccination against shingles. Today now he reports new acute flare very similar symptoms and rash except occurring on Right shoulder arm. Describes red raised itchy burning rash sensitive but not as painful as last time. - Admits possible trigger increased work hours, stress, physically worn out  Denies any fevers, chills, sweats, body ache, cough, shortness of breath, sinus pain or pressure, headache, abdominal pain, diarrhea  Past Medical History:  Diagnosis Date  . Barrett's esophagus   . GERD (gastroesophageal reflux disease)   . Heart murmur   . Hyperlipidemia    Social History   Tobacco Use  . Smoking status: Never Smoker  . Smokeless tobacco: Never Used  Vaping Use  . Vaping Use: Never used  Substance Use  Topics  . Alcohol use: No  . Drug use: No    Current Outpatient Medications:  .  esomeprazole (NEXIUM) 40 MG capsule, Take 1 capsule (40 mg total) by mouth daily., Disp: 90 capsule, Rfl: 3 .  valACYclovir (VALTREX) 1000 MG tablet, Take 1 tablet (1,000 mg total) by mouth 3 (three) times daily. For shingles, Disp: 21 tablet, Rfl: 0  Depression screen [facility] 2/9 09/16/2018 04/07/2018 07/05/2017  Decreased Interest 0 0 0  Down, Depressed, Hopeless 0 0 0  PHQ - 2 Score 0 0 0    No flowsheet data found.  -------------------------------------------------------------------------- O: No physical exam performed due to remote telephone encounter.  Lab results reviewed.  Recent Results (from the past 2160 hour(s))  Novel Coronavirus, NAA ([facility])     Status: None   Collection Time: 06/01/20  3:56 PM   Specimen: Nasopharyngeal(NP) swabs in vial transport medium   Nasopharynge  Screenin  Result Value Ref Range   SARS-CoV-2, NAA Not Detected Not Detected    Comment: This nucleic acid amplification test was developed and its performance characteristics determined by [employer]. Nucleic acid amplification tests include RT-PCR and TMA. This test has not been FDA cleared or approved. This test has been authorized by FDA under an Emergency Use Authorization (EUA). This test is only authorized for the duration of time the declaration that circumstances exist justifying the authorization of the emergency use of in vitro diagnostic tests for detection of SARS-CoV-2 virus and/or diagnosis of COVID-19 infection under section 564(b)(1) of the Act, 21 U.S.C. 505LZJ-6(B) (1), unless the authorization is terminated or revoked sooner. When diagnostic testing is negative, the possibility of a false negative result should be  considered in the context of a patient's recent exposures and the presence of clinical signs and symptoms consistent with COVID-19. An individual without symptoms of  COVID-19 and who is not shedding SARS-CoV-2 virus wo uld expect to have a negative (not detected) result in this assay.   SARS-COV-2, NAA 2 DAY TAT     Status: None   Collection Time: 06/01/20  3:56 PM   Nasopharynge  Screenin  Result Value Ref Range   SARS-CoV-2, NAA 2 DAY TAT Performed     -------------------------------------------------------------------------- A&P:  Problem List Items Addressed This Visit    None    Visit Diagnoses    Herpes zoster without complication    -  Primary   Relevant Medications   valACYclovir (VALTREX) 1000 MG tablet     Clinically consistent with new acute shingles herpes zoster outbreak onset 1-2 days ago, with mild neuropathic pain. 2nd flare of shingles, initial was 08/2017 on Left abdomen/flank. Now this is occurring on R shoulder / arm. - Never received zoster vaccine - No evidence of complications, such as ocular involvement  Plan: 1. Start Valacyclovir 1000mg  TID with food x 7 days, counseled on treatment course and side effects 2. Discussed potential course of post-herpetic neuralgia symptoms - offered medication options but these were declined. He tried gabapentin last time 08/2017 and it was not very effective  Follow-up as needed within 4-6 weeks if not resolving or if residual pain, strict return criteria if facial or ocular involvement or other acute concerns  Future can get Shingrix vaccine >6 weeks after shingles has resolved.   Meds ordered this encounter  Medications  . valACYclovir (VALTREX) 1000 MG tablet    Sig: Take 1 tablet (1,000 mg total) by mouth 3 (three) times daily. For shingles    Dispense:  21 tablet    Refill:  0    Follow-up: - Return in 1-3 months for physical + labs (CMET A1c CBC Lipid HIV screen, Hep C screen)  Patient verbalizes understanding with the above medical recommendations including the limitation of remote medical advice.  Specific follow-up and call-back criteria were given for patient to  follow-up or seek medical care more urgently if needed.   - Time spent in direct consultation with patient on phone: 9 minutes   09/2017, DO [facility] 07/29/2020, 4:27 PM

## 2020-08-01 ENCOUNTER — Other Ambulatory Visit: Payer: Self-pay | Admitting: Family Medicine

## 2020-08-01 DIAGNOSIS — K219 Gastro-esophageal reflux disease without esophagitis: Secondary | ICD-10-CM

## 2020-08-03 ENCOUNTER — Telehealth: Payer: Self-pay | Admitting: Family Medicine

## 2020-08-18 ENCOUNTER — Other Ambulatory Visit: Payer: Self-pay | Admitting: Family Medicine

## 2020-08-18 DIAGNOSIS — K219 Gastro-esophageal reflux disease without esophagitis: Secondary | ICD-10-CM

## 2020-08-22 ENCOUNTER — Other Ambulatory Visit: Payer: Self-pay

## 2020-08-22 ENCOUNTER — Encounter: Payer: Self-pay | Admitting: Family Medicine

## 2020-08-22 ENCOUNTER — Ambulatory Visit (INDEPENDENT_AMBULATORY_CARE_PROVIDER_SITE_OTHER): Payer: BC Managed Care – PPO | Admitting: Family Medicine

## 2020-08-22 VITALS — BP 133/71 | HR 64 | Temp 97.3°F | Ht 70.0 in | Wt 200.6 lb

## 2020-08-22 DIAGNOSIS — E785 Hyperlipidemia, unspecified: Secondary | ICD-10-CM | POA: Diagnosis not present

## 2020-08-22 DIAGNOSIS — Z0000 Encounter for general adult medical examination without abnormal findings: Secondary | ICD-10-CM

## 2020-08-22 DIAGNOSIS — Z23 Encounter for immunization: Secondary | ICD-10-CM | POA: Diagnosis not present

## 2020-08-22 NOTE — Patient Instructions (Addendum)
Thank you for coming to the office today.  Keep up good work overall.  Please schedule a Follow-up Appointment to: Return in about 1 year (around 08/22/2021) for 1 year Annual Physical (fasting lab after apt in AM).  If you have any other questions or concerns, please feel free to call the office or send a message through MyChart. You may also schedule an earlier appointment if necessary.  Additionally, you may be receiving a survey about your experience at our office within a few days to 1 week by e-mail or mail. We value your feedback.  Saralyn Pilar, DO [facility], [place]

## 2020-08-22 NOTE — Progress Notes (Signed)
Subjective:    Patient ID: Jay Buchanan, male    DOB: [birth year], 39 y.o.   MRN: [id number]  Jay Buchanan is a 39 y.o. male presenting on 08/22/2020 for Annual Exam   HPI   Here for Annual Physical and Due for fasting labs today.  HYPERLIPIDEMIA / Overweight BMI >28 Lifestyle: - Reports no specificconcerns. He is fasting today for lab - Last lipid panel1/2020 due for fasting lipid - Never been on any cholesterol medication Lifestyle - Weight down 20 lbs in over 1 year - Diet:Improving diet, mostly balanced, working on drinking more water - Exercise:Limited exercise, admits needs to improve, he keeps track of steps often up to 30k, sometimes only "activity" otherwise measures more cardio exercise at times  GERD / Barrett's Esophagus -Previously followed by Dr Servando Snare GI, had EGD and Colonoscopy on 12/13/14, see chart for details, review with EGD for GERD and dx with esophagitis and found to have Barrett's esophagus. Repeat EGD done 05/2019 Dr Servando Snare [facility] - negative, no Barrett's, no precancer no further surveillance for while - He is doing very well on Nexium 40mg  daily, without any breakthrough symptoms  Additional history Currently working at in [place], he changed locations due to work stress environment now improved  Recent repeat shingles episode now resolved  Health Maintenance: Last TDap approx 2011, before son was born for pertussis coverage  Colon CA Screening: Last Colonoscopy (done by Dr 2012 [facility]), results with no polyps, good for likely up to 10 years. Currently asymptomatic. No known family history of colon CA.  Prostate CA Screening: No prior prostate CA screening. Currently asymptomatic. Known family history of prostate CA w/ father age 74-60s. Due for screening approximately at age 13 as discussed previously, sooner if symptoms.  Depression screen [facility] 2/9 08/22/2020 09/16/2018 04/07/2018  Decreased Interest 0 0 0  Down, Depressed, Hopeless 0 0  0  PHQ - 2 Score 0 0 0    Past Medical History:  Diagnosis Date  . Barrett's esophagus   . GERD (gastroesophageal reflux disease)   . Heart murmur   . Hyperlipidemia    Past Surgical History:  Procedure Laterality Date  . COLONOSCOPY WITH ESOPHAGOGASTRODUODENOSCOPY (EGD)  12/13/2014   Dr 02/12/2015 [facility]  . ESOPHAGOGASTRODUODENOSCOPY (EGD) WITH PROPOFOL N/A 05/21/2019   Procedure: ESOPHAGOGASTRODUODENOSCOPY (EGD) WITH PROPOFOL;  Surgeon: 07/21/2019, MD;  Location: [facility];  Service: Endoscopy;  Laterality: N/A;   Social History   Socioeconomic History  . Marital status: Married    Spouse name: Jay Buchanan  . Number of children: 2  . Years of education: College  . Highest education level: Not on file  Occupational History  . Occupation: [employer] (often does heavy lifting)  Tobacco Use  . Smoking status: Never Smoker  . Smokeless tobacco: Never Used  Vaping Use  . Vaping Use: Never used  Substance and Sexual Activity  . Alcohol use: No  . Drug use: No  . Sexual activity: Not on file  Other Topics Concern  . Not on file  Social History Narrative  . Not on file   Social Determinants of Health   Financial Resource Strain: Not on file  Food Insecurity: Not on file  Transportation Needs: Not on file  Physical Activity: Not on file  Stress: Not on file  Social Connections: Not on file  Intimate Partner Violence: Not on file   Family History  Problem Relation Age of Onset  .  Diabetes Father   . Cancer Father        skin  . Hypertension Father   . Prostate cancer Father        late 5s early 60s  . Barrett's esophagus Father   . Heart disease Father   . Lung cancer Maternal Grandfather   . Bladder Cancer Maternal Grandfather    Current Outpatient Medications on File Prior to Visit  Medication Sig  . esomeprazole (NEXIUM) 40 MG capsule Take 1 capsule (40 mg total) by mouth daily.   No current facility-administered  medications on file prior to visit.    Review of Systems  Constitutional: Negative for activity change, appetite change, chills, diaphoresis, fatigue and fever.  HENT: Negative for congestion and hearing loss.   Eyes: Negative for visual disturbance.  Respiratory: Negative for apnea, cough, chest tightness, shortness of breath and wheezing.   Cardiovascular: Negative for chest pain, palpitations and leg swelling.  Gastrointestinal: Negative for abdominal pain, constipation, diarrhea, nausea and vomiting.  Endocrine: Negative for cold intolerance.  Genitourinary: Negative for difficulty urinating, dysuria, frequency and hematuria.  Musculoskeletal: Negative for arthralgias and neck pain.  Skin: Negative for rash.  Allergic/Immunologic: Negative for environmental allergies.  Neurological: Negative for dizziness, weakness, light-headedness, numbness and headaches.  Hematological: Negative for adenopathy.  Psychiatric/Behavioral: Negative for behavioral problems, dysphoric mood and sleep disturbance.   Per HPI unless specifically indicated above       Objective:    BP 133/71   Pulse 64   Temp (!) 97.3 F (36.3 C) (Temporal)   Ht 5\' 10"  (1.778 m)   Wt 200 lb 9.6 oz (91 kg)   SpO2 99%   BMI 28.78 kg/m   Wt Readings from Last 3 Encounters:  08/22/20 200 lb 9.6 oz (91 kg)  05/13/19 220 lb (99.8 kg)  09/16/18 212 lb 12.8 oz (96.5 kg)    Physical Exam Vitals and nursing note reviewed.  Constitutional:      General: He is not in acute distress.    Appearance: He is well-developed and well-nourished. He is not diaphoretic.     Comments: Well-appearing, comfortable, cooperative  HENT:     Head: Normocephalic and atraumatic.     Mouth/Throat:     Mouth: Oropharynx is clear and moist.  Eyes:     General:        Right eye: No discharge.        Left eye: No discharge.     Extraocular Movements: EOM normal.     Conjunctiva/sclera: Conjunctivae normal.     Pupils: Pupils are  equal, round, and reactive to light.  Neck:     Thyroid: No thyromegaly.  Cardiovascular:     Rate and Rhythm: Normal rate and regular rhythm.     Pulses: Intact distal pulses.     Heart sounds: Normal heart sounds. No murmur heard.   Pulmonary:     Effort: Pulmonary effort is normal. No respiratory distress.     Breath sounds: Normal breath sounds. No wheezing or rales.  Abdominal:     General: Bowel sounds are normal. There is no distension.     Palpations: Abdomen is soft. There is no mass.     Tenderness: There is no abdominal tenderness.  Musculoskeletal:        General: No tenderness or edema. Normal range of motion.     Cervical back: Normal range of motion and neck supple.     Comments: Upper / Lower Extremities: - Normal muscle  tone, strength bilateral upper extremities 5/5, lower extremities 5/5  Lymphadenopathy:     Cervical: No cervical adenopathy.  Skin:    General: Skin is warm and dry.     Findings: No erythema or rash.  Neurological:     Mental Status: He is alert and oriented to person, place, and time.     Comments: Distal sensation intact to light touch all extremities  Psychiatric:        Mood and Affect: Mood and affect normal.        Behavior: Behavior normal.     Comments: Well groomed, good eye contact, normal speech and thoughts       Results for orders placed or performed in visit on 06/01/20  Novel Coronavirus, NAA ([facility])   Specimen: Nasopharyngeal(NP) swabs in vial transport medium   Nasopharynge  Screenin  Result Value Ref Range   SARS-CoV-2, NAA Not Detected Not Detected  SARS-COV-2, NAA 2 DAY TAT   Nasopharynge  Screenin  Result Value Ref Range   SARS-CoV-2, NAA 2 DAY TAT Performed       Assessment & Plan:   Problem List Items Addressed This Visit   None   Visit Diagnoses    Annual physical exam    -  Primary   Relevant Orders   Hemoglobin A1c   CBC with Differential/Platelet   COMPLETE METABOLIC PANEL WITH GFR   Lipid  panel   Needs flu shot       Relevant Orders   Flu Vaccine QUAD 36+ mos IM (Completed)      Updated Health Maintenance information Due Flu Shot today Reviewed recent lab results with patient Encouraged improvement to lifestyle with diet and exercise - Goal of weight loss    No orders of the defined types were placed in this encounter.    Follow up plan: Return in about 1 year (around 08/22/2021) for 1 year Annual Physical (fasting lab after apt in AM).  Saralyn Pilar, DO [facility] 08/22/2020, 8:17 AM

## 2020-08-23 LAB — HEMOGLOBIN A1C
Hgb A1c MFr Bld: 5.5 % of total Hgb (ref <5.7)
Mean Plasma Glucose: 111 mg/dL
eAG (mmol/L): 6.2 mmol/L

## 2020-08-23 LAB — CBC WITH DIFFERENTIAL/PLATELET
Absolute Monocytes: 650 cells/uL (ref 200–950)
Basophils Absolute: 41 cells/uL (ref 0–200)
Basophils Relative: 0.7 %
Eosinophils Absolute: 621 cells/uL — ABNORMAL HIGH (ref 15–500)
Eosinophils Relative: 10.7 %
HCT: 42 % (ref 38.5–50.0)
Hemoglobin: 15 g/dL (ref 13.2–17.1)
Lymphs Abs: 1607 cells/uL (ref 850–3900)
MCH: 30.6 pg (ref 27.0–33.0)
MCHC: 35.7 g/dL (ref 32.0–36.0)
MCV: 85.7 fL (ref 80.0–100.0)
MPV: 9.9 fL (ref 7.5–12.5)
Monocytes Relative: 11.2 %
Neutro Abs: 2883 cells/uL (ref 1500–7800)
Neutrophils Relative %: 49.7 %
Platelets: 275 10*3/uL (ref 140–400)
RBC: 4.9 10*6/uL (ref 4.20–5.80)
RDW: 13.2 % (ref 11.0–15.0)
Total Lymphocyte: 27.7 %
WBC: 5.8 10*3/uL (ref 3.8–10.8)

## 2020-08-23 LAB — COMPLETE METABOLIC PANEL WITH GFR
AG Ratio: 1.6 (calc) (ref 1.0–2.5)
ALT: 79 U/L — ABNORMAL HIGH (ref 9–46)
AST: 45 U/L — ABNORMAL HIGH (ref 10–40)
Albumin: 4.2 g/dL (ref 3.6–5.1)
Alkaline phosphatase (APISO): 176 U/L — ABNORMAL HIGH (ref 36–130)
BUN: 22 mg/dL (ref 7–25)
CO2: 28 mmol/L (ref 20–32)
Calcium: 9.5 mg/dL (ref 8.6–10.3)
Chloride: 105 mmol/L (ref 98–110)
Creat: 0.9 mg/dL (ref 0.60–1.35)
GFR, Est African American: 123 mL/min/{1.73_m2} (ref >=60)
GFR, Est Non African American: 106 mL/min/{1.73_m2} (ref >=60)
Globulin: 2.6 g/dL (calc) (ref 1.9–3.7)
Glucose, Bld: 111 mg/dL — ABNORMAL HIGH (ref 65–99)
Potassium: 4.4 mmol/L (ref 3.5–5.3)
Sodium: 140 mmol/L (ref 135–146)
Total Bilirubin: 0.7 mg/dL (ref 0.2–1.2)
Total Protein: 6.8 g/dL (ref 6.1–8.1)

## 2020-08-23 LAB — LIPID PANEL
Cholesterol: 198 mg/dL (ref <200)
HDL: 67 mg/dL (ref >=40)
LDL Cholesterol (Calc): 117 mg/dL (calc) — ABNORMAL HIGH
Non-HDL Cholesterol (Calc): 131 mg/dL (calc) — ABNORMAL HIGH (ref <130)
Total CHOL/HDL Ratio: 3 (calc) (ref <5.0)
Triglycerides: 57 mg/dL (ref <150)

## 2020-09-07 ENCOUNTER — Other Ambulatory Visit: Payer: BC Managed Care – PPO

## 2020-09-07 ENCOUNTER — Other Ambulatory Visit: Payer: Self-pay

## 2020-09-07 DIAGNOSIS — Z20822 Contact with and (suspected) exposure to covid-19: Secondary | ICD-10-CM

## 2020-09-09 LAB — NOVEL CORONAVIRUS, NAA: SARS-CoV-2, NAA: NOT DETECTED

## 2020-09-09 LAB — SARS-COV-2, NAA 2 DAY TAT

## 2020-10-14 ENCOUNTER — Other Ambulatory Visit: Payer: Self-pay

## 2020-10-14 DIAGNOSIS — K219 Gastro-esophageal reflux disease without esophagitis: Secondary | ICD-10-CM

## 2020-10-14 MED ORDER — ESOMEPRAZOLE MAGNESIUM 40 MG PO CPDR: 40.0000 mg | DELAYED_RELEASE_CAPSULE | Freq: Every day | ORAL | 3 refills | Status: DC

## 2020-11-10 DIAGNOSIS — Z20822 Contact with and (suspected) exposure to covid-19: Secondary | ICD-10-CM | POA: Diagnosis not present

## 2020-11-10 DIAGNOSIS — Z03818 Encounter for observation for suspected exposure to other biological agents ruled out: Secondary | ICD-10-CM | POA: Diagnosis not present

## 2021-05-18 ENCOUNTER — Other Ambulatory Visit: Payer: Self-pay

## 2021-05-18 ENCOUNTER — Other Ambulatory Visit: Payer: BC Managed Care – PPO

## 2021-05-18 DIAGNOSIS — R059 Cough, unspecified: Secondary | ICD-10-CM

## 2021-05-19 LAB — NOVEL CORONAVIRUS, NAA: SARS-CoV-2, NAA: DETECTED — AB

## 2021-05-19 LAB — SARS-COV-2, NAA 2 DAY TAT

## 2021-07-12 ENCOUNTER — Other Ambulatory Visit: Payer: Self-pay

## 2021-07-12 ENCOUNTER — Other Ambulatory Visit: Payer: BC Managed Care – PPO

## 2021-07-12 DIAGNOSIS — J029 Acute pharyngitis, unspecified: Secondary | ICD-10-CM | POA: Diagnosis not present

## 2021-07-13 LAB — COVID-19, FLU A+B AND RSV
Influenza A, NAA: NOT DETECTED
Influenza B, NAA: NOT DETECTED
RSV, NAA: NOT DETECTED
SARS-CoV-2, NAA: NOT DETECTED

## 2021-07-13 LAB — SPECIMEN STATUS REPORT

## 2021-07-17 ENCOUNTER — Telehealth: Payer: BC Managed Care – PPO | Admitting: Emergency Medicine

## 2021-07-17 DIAGNOSIS — B9689 Other specified bacterial agents as the cause of diseases classified elsewhere: Secondary | ICD-10-CM | POA: Diagnosis not present

## 2021-07-17 DIAGNOSIS — J019 Acute sinusitis, unspecified: Secondary | ICD-10-CM | POA: Diagnosis not present

## 2021-07-17 MED ORDER — LEVOFLOXACIN 750 MG PO TABS: 750.0000 mg | ORAL_TABLET | Freq: Every day | ORAL | 0 refills | Status: DC

## 2021-07-17 NOTE — Progress Notes (Signed)
Based on the information that you have shared in the e-Visit Questionnaire, we recommend that you convert this visit to a video visit in order for the provider to better assess what is going on.  The provider will be able to give you a more accurate diagnosis and treatment plan if we can more freely discuss your symptoms and with the addition of a virtual examination.   If you convert to a video visit, we will bill your insurance (similar to an office visit) and you will not be charged for this e-Visit. You will be able to stay at home and speak with the first available [facility] advanced practice provider. The link to do a video visit is in the drop down Menu tab of your Welcome screen in MyChart.   Suggested pt could also use urgent care for face to face in person visit

## 2021-07-17 NOTE — Progress Notes (Signed)
Virtual Visit Consent   Jay Buchanan, you are scheduled for a virtual visit with a [facility] provider today.     Just as with appointments in the office, your consent must be obtained to participate.  Your consent will be active for this visit and any virtual visit you may have with one of our providers in the next 365 days.     If you have a MyChart account, a copy of this consent can be sent to you electronically.  All virtual visits are billed to your insurance company just like a traditional visit in the office.    As this is a virtual visit, video technology does not allow for your provider to perform a traditional examination.  This may limit your provider's ability to fully assess your condition.  If your provider identifies any concerns that need to be evaluated in person or the need to arrange testing (such as labs, EKG, etc.), we will make arrangements to do so.     Although advances in technology are sophisticated, we cannot ensure that it will always work on either your end or our end.  If the connection with a video visit is poor, the visit may have to be switched to a telephone visit.  With either a video or telephone visit, we are not always able to ensure that we have a secure connection.     I need to obtain your verbal consent now.   Are you willing to proceed with your visit today?    Jay Buchanan has provided verbal consent on 07/17/2021 for a virtual visit (video or telephone).   Cathlyn Parsons, NP   Date: 07/17/2021 3:54 PM   Virtual Visit via Video Note   I, Cathlyn Parsons, connected with  Jay Buchanan  ([id number], 12-07-1980) on 07/17/21 at  3:45 PM EST by a video-enabled telemedicine application and verified that I am speaking with the correct person using two identifiers.  Location: Patient: Virtual Visit Location Patient: Other: work Provider: Pharmacist, community: Home Office   I discussed the limitations of evaluation and management by  telemedicine and the availability of in person appointments. The patient expressed understanding and agreed to proceed.    History of Present Illness: Jay Buchanan is a 40 y.o. who identifies as a male who was assigned male at birth, and is being seen today for chest and head congestion for a week.  He reports he has been sick for about 8 days and has been getting progressively worse.  It started with nasal congestion and now includes a productive cough at night for green sputum, green drainage coming from his nose, his eyes are glued shut every morning bilaterally, increased ear pressure, and shortness of breath.  He did check his pulse ox and it was greater than 95%.  He took a COVID, flu, and RSV test and they were all negative.  But he feels he is getting worse.  HPI: HPI  Problems:  Patient Active Problem List   Diagnosis Date Noted   Acute gastritis without hemorrhage    Overweight (BMI 25.0-29.9) 09/16/2018   History of shingles 08/15/2017   Osteoarthritis of multiple joints 07/05/2017   Trigger finger of right hand 07/05/2017   Elevated ALT measurement 07/01/2017   GERD (gastroesophageal reflux disease) 04/06/2017   History of back pain 04/06/2017   Hyperlipidemia 04/05/2017   Barrett's esophagus 04/05/2017    Allergies: No Known Allergies Medications:  Current Outpatient Medications:    levofloxacin (  LEVAQUIN) 750 MG tablet, Take 1 tablet (750 mg total) by mouth daily., Disp: 5 tablet, Rfl: 0   esomeprazole (NEXIUM) 40 MG capsule, Take 1 capsule (40 mg total) by mouth daily., Disp: 90 capsule, Rfl: 3  Observations/Objective: Patient is well-developed, well-nourished in no acute distress.  Resting comfortably  at work  Head is normocephalic, atraumatic.  No labored breathing.  Speech is clear and coherent with logical content.  Patient is alert and oriented at baseline.  Patient sounds congested   Assessment and Plan: 1. Acute bacterial sinusitis  I suspect this is an  acute bacterial sinusitis given that he has green drainage from his nose as well as both of his eyes being glued shut.  However, the fact he reports shortness of breath is concerning.  I prescribed levofloxacin 750 mg which is the dose for pneumonia but will also cover sinusitis.  I discussed with him that he needs to seek in person care if his shortness of breath worsens despite treatment.  Follow Up Instructions: I discussed the assessment and treatment plan with the patient. The patient was provided an opportunity to ask questions and all were answered. The patient agreed with the plan and demonstrated an understanding of the instructions.  A copy of instructions were sent to the patient via MyChart unless otherwise noted below.   The patient was advised to call back or seek an in-person evaluation if the symptoms worsen or if the condition fails to improve as anticipated.  Time:  I spent 10 minutes with the patient via telehealth technology discussing the above problems/concerns.    Cathlyn Parsons, NP

## 2021-07-17 NOTE — Patient Instructions (Signed)
  Vaughan Sine, thank you for joining Cathlyn Parsons, NP for today's virtual visit.  While this provider is not your primary care provider (PCP), if your PCP is located in our provider database this encounter information will be shared with them immediately following your visit.  Consent: (Patient) [name] provided verbal consent for this virtual visit at the beginning of the encounter.  Current Medications:  Current Outpatient Medications:    levofloxacin (LEVAQUIN) 750 MG tablet, Take 1 tablet (750 mg total) by mouth daily., Disp: 5 tablet, Rfl: 0   esomeprazole (NEXIUM) 40 MG capsule, Take 1 capsule (40 mg total) by mouth daily., Disp: 90 capsule, Rfl: 3   Medications ordered in this encounter:  Meds ordered this encounter  Medications   levofloxacin (LEVAQUIN) 750 MG tablet    Sig: Take 1 tablet (750 mg total) by mouth daily.    Dispense:  5 tablet    Refill:  0     *If you need refills on other medications prior to your next appointment, please contact your pharmacy*  Follow-Up: Call back or seek an in-person evaluation if the symptoms worsen or if the condition fails to improve as anticipated.  Other Instructions Continue taking Mucinex D and drinking lots of fluids.  Finish all the antibiotics even if you are feeling better.  Pay attention to your shortness of breath, and if you feel like it is getting worse, you are going to need to be seen in person such as at an urgent care or by your primary care provider.   If you have been instructed to have an in-person evaluation today at a local Urgent Care facility, please use the link below. It will take you to a list of all of our available [facility], including address, phone number and hours of operation. Please do not delay care.  [facility]  If you or a family member do not have a primary care provider, use the link below to schedule a visit and establish care. When you choose a [facility]  primary care physician or advanced practice provider, you gain a long-term partner in health. Find a Primary Care Provider  Learn more about [facility]'s in-office and virtual care options: [facility] - Get Care Now

## 2021-08-12 ENCOUNTER — Other Ambulatory Visit: Payer: Self-pay | Admitting: Family Medicine

## 2021-08-12 DIAGNOSIS — K219 Gastro-esophageal reflux disease without esophagitis: Secondary | ICD-10-CM

## 2021-08-13 NOTE — Telephone Encounter (Signed)
last RF 10/14/20 #90 3 RF  requested too soon Requested Prescriptions  Refused Prescriptions Disp Refills  . esomeprazole (NEXIUM) 40 MG capsule [Pharmacy Med Name: Esomeprazole Magnesium 40 MG Oral Capsule Delayed Release] 90 capsule 3    Sig: TAKE 1 CAPSULE BY MOUTH  DAILY     Gastroenterology: Proton Pump Inhibitors Passed - 08/12/2021 10:26 PM      Passed - Valid encounter within last 12 months    Recent Outpatient Visits          11 months ago Annual physical exam   [facility] Smitty Cords, DO   1 year ago Herpes zoster without complication   [facility] [place], Netta Neat, DO   2 years ago Annual physical exam   [facility] Smitty Cords, DO   3 years ago Acute non-recurrent maxillary sinusitis   [facility] Smitty Cords, DO   3 years ago Herpes zoster without complication   [facility], Netta Neat, DO

## 2021-09-19 ENCOUNTER — Ambulatory Visit: Payer: BC Managed Care – PPO | Attending: Internal Medicine

## 2021-09-19 ENCOUNTER — Other Ambulatory Visit: Payer: Self-pay

## 2021-09-19 DIAGNOSIS — Z23 Encounter for immunization: Secondary | ICD-10-CM

## 2021-09-19 MED ORDER — PFIZER COVID-19 VAC BIVALENT 30 MCG/0.3ML IM SUSP
INTRAMUSCULAR | 0 refills | Status: DC
  Filled 2021-09-19: qty 0.3, 1d supply, fill #0

## 2021-09-19 NOTE — Progress Notes (Signed)
° °  Covid-19 Vaccination Clinic  Name:  Jay Buchanan    MRN: [id number] DOB: 1980-11-23  09/19/2021  Jay Buchanan was observed post Covid-19 immunization for 15 minutes without incident. He was provided with Vaccine Information Sheet and instruction to access the V-Safe system.   Jay Buchanan was instructed to call 911 with any severe reactions post vaccine: Difficulty breathing  Swelling of face and throat  A fast heartbeat  A bad rash all over body  Dizziness and weakness   Immunizations Administered     Name Date Dose VIS Date Route   Pfizer Covid-19 Vaccine Bivalent Booster 09/19/2021 11:17 AM 0.3 mL 05/10/2021 Intramuscular   Manufacturer: Reader   Lot: [id number]   [place]: [place], PharmD, MBA Clinical Acute Care Pharmacist

## 2021-10-09 ENCOUNTER — Other Ambulatory Visit: Payer: Self-pay | Admitting: Family Medicine

## 2021-10-09 DIAGNOSIS — K219 Gastro-esophageal reflux disease without esophagitis: Secondary | ICD-10-CM

## 2021-10-10 NOTE — Telephone Encounter (Signed)
Requested Prescriptions  Pending Prescriptions Disp Refills   esomeprazole (NEXIUM) 40 MG capsule [Pharmacy Med Name: Esomeprazole Magnesium 40 MG Oral Capsule Delayed Release] 90 capsule 3    Sig: TAKE 1 CAPSULE BY MOUTH  DAILY     Gastroenterology: Proton Pump Inhibitors Failed - 10/09/2021 11:50 PM      Failed - Valid encounter within last 12 months    Recent Outpatient Visits          1 year ago Annual physical exam   [place], DO   1 year ago Herpes zoster without complication   [place], DO   3 years ago Annual physical exam   [facility] Olin Hauser, DO   3 years ago Acute non-recurrent maxillary sinusitis   [facility] Olin Hauser, DO   4 years ago Herpes zoster without complication   [place], DO             Courtesy refill. Patient must have office visit for further refills.

## 2021-10-19 ENCOUNTER — Other Ambulatory Visit: Payer: Self-pay | Admitting: Family Medicine

## 2021-10-19 DIAGNOSIS — K219 Gastro-esophageal reflux disease without esophagitis: Secondary | ICD-10-CM

## 2021-10-20 MED ORDER — ESOMEPRAZOLE MAGNESIUM 40 MG PO CPDR: 40.0000 mg | DELAYED_RELEASE_CAPSULE | Freq: Every day | ORAL | 0 refills | Status: DC

## 2021-10-25 ENCOUNTER — Ambulatory Visit (INDEPENDENT_AMBULATORY_CARE_PROVIDER_SITE_OTHER): Payer: BC Managed Care – PPO | Admitting: Family Medicine

## 2021-10-25 ENCOUNTER — Encounter: Payer: Self-pay | Admitting: Family Medicine

## 2021-10-25 ENCOUNTER — Other Ambulatory Visit: Payer: Self-pay

## 2021-10-25 VITALS — BP 118/72 | HR 56 | Ht 71.0 in | Wt 210.8 lb

## 2021-10-25 DIAGNOSIS — Z1159 Encounter for screening for other viral diseases: Secondary | ICD-10-CM

## 2021-10-25 DIAGNOSIS — E663 Overweight: Secondary | ICD-10-CM

## 2021-10-25 DIAGNOSIS — E7800 Pure hypercholesterolemia, unspecified: Secondary | ICD-10-CM | POA: Diagnosis not present

## 2021-10-25 DIAGNOSIS — Z8042 Family history of malignant neoplasm of prostate: Secondary | ICD-10-CM

## 2021-10-25 DIAGNOSIS — Z0000 Encounter for general adult medical examination without abnormal findings: Secondary | ICD-10-CM

## 2021-10-25 DIAGNOSIS — R7309 Other abnormal glucose: Secondary | ICD-10-CM | POA: Diagnosis not present

## 2021-10-25 DIAGNOSIS — R748 Abnormal levels of other serum enzymes: Secondary | ICD-10-CM | POA: Insufficient documentation

## 2021-10-25 DIAGNOSIS — Z125 Encounter for screening for malignant neoplasm of prostate: Secondary | ICD-10-CM | POA: Diagnosis not present

## 2021-10-25 DIAGNOSIS — K219 Gastro-esophageal reflux disease without esophagitis: Secondary | ICD-10-CM

## 2021-10-25 MED ORDER — ESOMEPRAZOLE MAGNESIUM 40 MG PO CPDR: 40.0000 mg | DELAYED_RELEASE_CAPSULE | Freq: Every day | ORAL | 3 refills | Status: DC

## 2021-10-25 NOTE — Patient Instructions (Addendum)
Thank you for coming to the office today.  Due for TDap vaccine, last one was 2011. Good for 10 years.  Stay tuned for results with liver enzymes and cholesterol  If elevated we can order Ultrasound of liver. Stay tuned  DUE for FASTING BLOOD WORK (no food or drink after midnight before the lab appointment, only water or coffee without cream/sugar on the morning of)  SCHEDULE "Lab Only" visit in the morning at the clinic for lab draw in 1 YEAR  - Make sure Lab Only appointment is at about 1 week before your next appointment, so that results will be available  For Lab Results, once available within 2-3 days of blood draw, you can can log in to MyChart online to view your results and a brief explanation. Also, we can discuss results at next follow-up visit.   Please schedule a Follow-up Appointment to: Return in about 1 year (around 10/25/2022) for 1 year Annual Physical and fasting lab AFTER am apt.  If you have any other questions or concerns, please feel free to call the office or send a message through MyChart. You may also schedule an earlier appointment if necessary.  Additionally, you may be receiving a survey about your experience at our office within a few days to 1 week by e-mail or mail. We value your feedback.  Saralyn Pilar, DO [facility], [place]

## 2021-10-25 NOTE — Assessment & Plan Note (Signed)
Previously mostly controlled cholesterol except mild elevated LDL on lifestyle Calculated ASCVD 10 yr risk score = low, age < 40  Likely causing fatty liver with elevated LFTs  Plan: 1. Check fasting lipid panel today - stay tuned for results, previously reviewed ASCVD risk 2. Encourage improved lifestyle - low carb/cholesterol, reduce portion size, start regular exercise 3. Follow-up yearly lipids

## 2021-10-25 NOTE — Progress Notes (Addendum)
Subjective:    Patient ID: Jay Buchanan, male    DOB: 03/02/1981, 41 y.o.   MRN: [id number]  Jay Buchanan is a 41 y.o. male presenting on 10/25/2021 for Annual Exam   HPI  Here for Annual Physical and Due for fasting labs today.   HYPERLIPIDEMIA / Overweight BMI >29 Lifestyle:  Skips breakfast / limited lunch due to work schedule He admits usually eating larger meal dinner between 5-7pm, going to bed 10-11pm. - Never been on any cholesterol medication Lifestyle - Weight up 10 lbs in 6 months - Diet: Improving diet, mostly balanced, working on drinking more water - Exercise: Limited exercise, admits needs to improve, he keeps track of steps often up to 30k, sometimes only "activity" otherwise measures more cardio exercise at times   GERD / Barrett's Esophagus - Previously followed by Dr Allen Norris GI, had EGD and Colonoscopy on 12/13/14, see chart for details, review with EGD for GERD and dx with esophagitis and found to have Barrett's esophagus. Repeat EGD done 05/2019 Dr Allen Norris AGI - negative, no Barrett's, no precancer no further surveillance for while - He is doing very well on Nexium 40m daily, without any breakthrough symptoms     Health Maintenance: Last TDap approx 2011, before son was born for pertussis coverage   Colon CA Screening: Last Colonoscopy 12/2014 - (done by Dr WAllen NorrisAGI Mebae), results with no polyps, good for likely up to 10 years. Currently asymptomatic. No known family history of colon CA. Next due 2026   Prostate CA Screening: No prior prostate CA screening. Currently asymptomatic. Known family history of prostate CA w/ father age 42-60s Due for screening with PSA we discussed proceed today.   [facility] 10/25/2021 08/22/2020 09/16/2018  Decreased Interest 0 0 0  Down, Depressed, Hopeless 0 0 0  PHQ - 2 Score 0 0 0  Altered sleeping 0 - -  Tired, decreased energy 0 - -  Change in appetite 0 - -  Feeling bad or failure about yourself  0 - -  Trouble  concentrating 0 - -  Moving slowly or fidgety/restless 0 - -  Suicidal thoughts 0 - -  PHQ-9 Score 0 - -  Difficult doing work/chores Not difficult at all - -    Past Medical History:  Diagnosis Date   Barrett's esophagus    GERD (gastroesophageal reflux disease)    Heart murmur    Hyperlipidemia    Past Surgical History:  Procedure Laterality Date   COLONOSCOPY WITH ESOPHAGOGASTRODUODENOSCOPY (EGD)  12/13/2014   Dr WAllen Norris- [facility]   ESOPHAGOGASTRODUODENOSCOPY (EGD) WITH PROPOFOL N/A 05/21/2019   Procedure: ESOPHAGOGASTRODUODENOSCOPY (EGD) WITH PROPOFOL;  Surgeon: WLucilla Lame MD;  Location: [facility]  Service: Endoscopy;  Laterality: N/A;   Social History   Socioeconomic History   Marital status: Married    Spouse name: ASandra Tellefsen  Number of children: 2   Years of education: College   Highest education level: Not on file  Occupational History   Occupation: FAdvertising copywriter   Comment: PDoctor, 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)  Tobacco Use   Smoking status: Never   Smokeless tobacco: Never  Vaping Use   Vaping Use: Never used  Substance and Sexual Activity   Alcohol use: No   Drug use: No   Sexual activity: Not on file  Other Topics Concern   Not on file  Social History Narrative   Not on file   Social Determinants of Health  Financial Resource Strain: Not on file  Food Insecurity: Not on file  Transportation Needs: Not on file  Physical Activity: Not on file  Stress: Not on file  Social Connections: Not on file  Intimate Partner Violence: Not on file   Family History  Problem Relation Age of Onset   Diabetes Father    Cancer Father        skin   Hypertension Father    Prostate cancer Father        late 16s early 18s   Barrett's esophagus Father    Heart disease Father    Lung cancer Maternal Grandfather    Bladder Cancer Maternal Grandfather    No current outpatient medications on file prior to visit.   No current facility-administered  medications on file prior to visit.    Review of Systems  Constitutional:  Negative for activity change, appetite change, chills, diaphoresis, fatigue and fever.  HENT:  Negative for congestion and hearing loss.   Eyes:  Negative for visual disturbance.  Respiratory:  Negative for cough, chest tightness, shortness of breath and wheezing.   Cardiovascular:  Negative for chest pain, palpitations and leg swelling.  Gastrointestinal:  Negative for abdominal pain, constipation, diarrhea, nausea and vomiting.  Genitourinary:  Negative for dysuria, frequency and hematuria.  Musculoskeletal:  Negative for arthralgias and neck pain.  Skin:  Negative for rash.  Neurological:  Negative for dizziness, weakness, light-headedness, numbness and headaches.  Hematological:  Negative for adenopathy.  Psychiatric/Behavioral:  Negative for behavioral problems, dysphoric mood and sleep disturbance.   Per HPI unless specifically indicated above      Objective:    BP 118/72    Pulse (!) 56    Ht 5' 11"  (1.803 m)    Wt 210 lb 12.8 oz (95.6 kg)    SpO2 100%    BMI 29.40 kg/m   Wt Readings from Last 3 Encounters:  10/25/21 210 lb 12.8 oz (95.6 kg)  05/18/21 200 lb (90.7 kg)  08/22/20 200 lb 9.6 oz (91 kg)    Physical Exam Vitals and nursing note reviewed.  Constitutional:      General: He is not in acute distress.    Appearance: He is well-developed. He is not diaphoretic.     Comments: Well-appearing, comfortable, cooperative  HENT:     Head: Normocephalic and atraumatic.  Eyes:     General:        Right eye: No discharge.        Left eye: No discharge.     Conjunctiva/sclera: Conjunctivae normal.     Pupils: Pupils are equal, round, and reactive to light.  Neck:     Thyroid: No thyromegaly.     Vascular: No carotid bruit.  Cardiovascular:     Rate and Rhythm: Normal rate and regular rhythm.     Pulses: Normal pulses.     Heart sounds: Normal heart sounds. No murmur heard. Pulmonary:      Effort: Pulmonary effort is normal. No respiratory distress.     Breath sounds: Normal breath sounds. No wheezing or rales.  Abdominal:     General: Bowel sounds are normal. There is no distension.     Palpations: Abdomen is soft. There is no mass.     Tenderness: There is no abdominal tenderness.  Musculoskeletal:        General: No tenderness. Normal range of motion.     Cervical back: Normal range of motion and neck supple.     Right  lower leg: No edema.     Left lower leg: No edema.     Comments: Upper / Lower Extremities: - Normal muscle tone, strength bilateral upper extremities 5/5, lower extremities 5/5  Lymphadenopathy:     Cervical: No cervical adenopathy.  Skin:    General: Skin is warm and dry.     Findings: No erythema or rash.  Neurological:     Mental Status: He is alert and oriented to person, place, and time.     Comments: Distal sensation intact to light touch all extremities  Psychiatric:        Mood and Affect: Mood normal.        Behavior: Behavior normal.        Thought Content: Thought content normal.     Comments: Well groomed, good eye contact, normal speech and thoughts   Results for orders placed or performed in visit on 10/25/21  COMPLETE METABOLIC PANEL WITH GFR  Result Value Ref Range   Glucose, Bld 108 (H) 65 - 99 mg/dL   BUN 21 7 - 25 mg/dL   Creat 0.94 0.60 - 1.29 mg/dL   eGFR 104 > OR = 60 mL/min/1.53m   BUN/Creatinine Ratio NOT APPLICABLE 6 - 22 (calc)   Sodium 140 135 - 146 mmol/L   Potassium 5.1 3.5 - 5.3 mmol/L   Chloride 104 98 - 110 mmol/L   CO2 30 20 - 32 mmol/L   Calcium 9.5 8.6 - 10.3 mg/dL   Total Protein 7.3 6.1 - 8.1 g/dL   Albumin 4.4 3.6 - 5.1 g/dL   Globulin 2.9 1.9 - 3.7 g/dL (calc)   AG Ratio 1.5 1.0 - 2.5 (calc)   Total Bilirubin 0.8 0.2 - 1.2 mg/dL   Alkaline phosphatase (APISO) 250 (H) 36 - 130 U/L   AST 54 (H) 10 - 40 U/L   ALT 101 (H) 9 - 46 U/L  Lipid panel  Result Value Ref Range   Cholesterol 215 (H) <200  mg/dL   HDL 74 > OR = 40 mg/dL   Triglycerides 51 <150 mg/dL   LDL Cholesterol (Calc) 127 (H) mg/dL (calc)   Total CHOL/HDL Ratio 2.9 <5.0 (calc)   Non-HDL Cholesterol (Calc) 141 (H) <130 mg/dL (calc)  CBC with Differential/Platelet  Result Value Ref Range   WBC 5.5 3.8 - 10.8 Thousand/uL   RBC 5.08 4.20 - 5.80 Million/uL   Hemoglobin 15.2 13.2 - 17.1 g/dL   HCT 44.5 38.5 - 50.0 %   MCV 87.6 80.0 - 100.0 fL   MCH 29.9 27.0 - 33.0 pg   MCHC 34.2 32.0 - 36.0 g/dL   RDW 12.4 11.0 - 15.0 %   Platelets 310 140 - 400 Thousand/uL   MPV 9.8 7.5 - 12.5 fL   Neutro Abs 2,596 1,500 - 7,800 cells/uL   Lymphs Abs 1,958 850 - 3,900 cells/uL   Absolute Monocytes 528 200 - 950 cells/uL   Eosinophils Absolute 358 15 - 500 cells/uL   Basophils Absolute 61 0 - 200 cells/uL   Neutrophils Relative % 47.2 %   Total Lymphocyte 35.6 %   Monocytes Relative 9.6 %   Eosinophils Relative 6.5 %   Basophils Relative 1.1 %  Hemoglobin A1c  Result Value Ref Range   Hgb A1c MFr Bld 5.4 <5.7 % of total Hgb   Mean Plasma Glucose 108 mg/dL   eAG (mmol/L) 6.0 mmol/L      Assessment & Plan:   Problem List Items Addressed This Visit  Overweight (BMI 25.0-29.9)   Hyperlipidemia    Previously mostly controlled cholesterol except mild elevated LDL on lifestyle Calculated ASCVD 10 yr risk score = low, age < 48  Likely causing fatty liver with elevated LFTs  Plan: 1. Check fasting lipid panel today - stay tuned for results, previously reviewed ASCVD risk 2. Encourage improved lifestyle - low carb/cholesterol, reduce portion size, start regular exercise 3. Follow-up yearly lipids      Relevant Orders   Lipid panel (Completed)   TSH   GERD (gastroesophageal reflux disease)   Relevant Medications   esomeprazole (NEXIUM) 40 MG capsule   Elevated liver enzymes   Relevant Orders   COMPLETE METABOLIC PANEL WITH GFR (Completed)   Other Visit Diagnoses     Annual physical exam    -  Primary   Relevant  Orders   COMPLETE METABOLIC PANEL WITH GFR (Completed)   Lipid panel (Completed)   CBC with Differential/Platelet (Completed)   Hemoglobin A1c (Completed)   Screening for prostate cancer       Relevant Orders   PSA   Family history of prostate cancer       Relevant Orders   PSA   Abnormal glucose       Relevant Orders   Hemoglobin A1c (Completed)   Need for hepatitis C screening test       Relevant Orders   Hepatitis C antibody       Updated Health Maintenance information Return for TDap vaccine. Fam history of Prostate CA Father age 33s, will check PSA today for him. Fasting labs today, pending result Encouraged improvement to lifestyle with diet and exercise Goal of weight loss  Elevated LFTs Likely due to wt and cholesterol elevated If still abnormal, consider RUQ Liver US  Meds ordered this encounter  Medications   esomeprazole (NEXIUM) 40 MG capsule    Sig: Take 1 capsule (40 mg total) by mouth daily before breakfast.    Dispense:  90 capsule    Refill:  3      Follow up plan: Return in about 1 year (around 10/25/2022) for 1 year Annual Physical and fasting lab AFTER am apt.  Nobie Putnam, [facility] 10/25/2021, 9:31 AM

## 2021-10-26 ENCOUNTER — Other Ambulatory Visit: Payer: Self-pay | Admitting: Family Medicine

## 2021-10-26 DIAGNOSIS — R748 Abnormal levels of other serum enzymes: Secondary | ICD-10-CM

## 2021-10-26 LAB — COMPLETE METABOLIC PANEL WITH GFR
AG Ratio: 1.5 (calc) (ref 1.0–2.5)
ALT: 101 U/L — ABNORMAL HIGH (ref 9–46)
AST: 54 U/L — ABNORMAL HIGH (ref 10–40)
Albumin: 4.4 g/dL (ref 3.6–5.1)
Alkaline phosphatase (APISO): 250 U/L — ABNORMAL HIGH (ref 36–130)
BUN: 21 mg/dL (ref 7–25)
CO2: 30 mmol/L (ref 20–32)
Calcium: 9.5 mg/dL (ref 8.6–10.3)
Chloride: 104 mmol/L (ref 98–110)
Creat: 0.94 mg/dL (ref 0.60–1.29)
Globulin: 2.9 g/dL (calc) (ref 1.9–3.7)
Glucose, Bld: 108 mg/dL — ABNORMAL HIGH (ref 65–99)
Potassium: 5.1 mmol/L (ref 3.5–5.3)
Sodium: 140 mmol/L (ref 135–146)
Total Bilirubin: 0.8 mg/dL (ref 0.2–1.2)
Total Protein: 7.3 g/dL (ref 6.1–8.1)
eGFR: 104 mL/min/{1.73_m2} (ref >=60)

## 2021-10-26 LAB — CBC WITH DIFFERENTIAL/PLATELET
Absolute Monocytes: 528 cells/uL (ref 200–950)
Basophils Absolute: 61 cells/uL (ref 0–200)
Basophils Relative: 1.1 %
Eosinophils Absolute: 358 cells/uL (ref 15–500)
Eosinophils Relative: 6.5 %
HCT: 44.5 % (ref 38.5–50.0)
Hemoglobin: 15.2 g/dL (ref 13.2–17.1)
Lymphs Abs: 1958 cells/uL (ref 850–3900)
MCH: 29.9 pg (ref 27.0–33.0)
MCHC: 34.2 g/dL (ref 32.0–36.0)
MCV: 87.6 fL (ref 80.0–100.0)
MPV: 9.8 fL (ref 7.5–12.5)
Monocytes Relative: 9.6 %
Neutro Abs: 2596 cells/uL (ref 1500–7800)
Neutrophils Relative %: 47.2 %
Platelets: 310 10*3/uL (ref 140–400)
RBC: 5.08 10*6/uL (ref 4.20–5.80)
RDW: 12.4 % (ref 11.0–15.0)
Total Lymphocyte: 35.6 %
WBC: 5.5 10*3/uL (ref 3.8–10.8)

## 2021-10-26 LAB — HEPATITIS C ANTIBODY
Hepatitis C Ab: NONREACTIVE
SIGNAL TO CUT-OFF: <0.02 (ref <1.00)

## 2021-10-26 LAB — LIPID PANEL
Cholesterol: 215 mg/dL — ABNORMAL HIGH (ref <200)
HDL: 74 mg/dL (ref >=40)
LDL Cholesterol (Calc): 127 mg/dL (calc) — ABNORMAL HIGH
Non-HDL Cholesterol (Calc): 141 mg/dL (calc) — ABNORMAL HIGH (ref <130)
Total CHOL/HDL Ratio: 2.9 (calc) (ref <5.0)
Triglycerides: 51 mg/dL (ref <150)

## 2021-10-26 LAB — HEMOGLOBIN A1C
Hgb A1c MFr Bld: 5.4 % of total Hgb (ref <5.7)
Mean Plasma Glucose: 108 mg/dL
eAG (mmol/L): 6 mmol/L

## 2021-10-26 LAB — PSA: PSA: 1.25 ng/mL (ref <=4.00)

## 2021-10-26 LAB — TSH: TSH: 0.95 mIU/L (ref 0.40–4.50)

## 2021-10-27 ENCOUNTER — Telehealth: Payer: BC Managed Care – PPO | Admitting: Emergency Medicine

## 2021-10-27 DIAGNOSIS — K0889 Other specified disorders of teeth and supporting structures: Secondary | ICD-10-CM

## 2021-10-27 MED ORDER — PENICILLIN V POTASSIUM 500 MG PO TABS: 500.0000 mg | ORAL_TABLET | Freq: Four times a day (QID) | ORAL | 0 refills | Status: DC

## 2021-10-27 NOTE — Progress Notes (Signed)
E-Visit for Dental Pain  We are sorry that you are not feeling well.  Here is how we plan to help!  Based on what you have shared with me in the questionnaire, it sounds like you have a developing infection.  You'll need to see a dentist, but we can start you on antibiotics.  PCN 500mg  3 times per day for 10 days, I also recommend continuing with over-the-counter Tylenol and Ibuprofen.  It is imperative that you see a dentist within 10 days of this eVisit to determine the cause of the dental pain and be sure it is adequately treated  A toothache or tooth pain is caused when the nerve in the root of a tooth or surrounding a tooth is irritated. Dental (tooth) infection, decay, injury, or loss of a tooth are the most common causes of dental pain. Pain may also occur after an extraction (tooth is pulled out). Pain sometimes originates from other areas and radiates to the jaw, thus appearing to be tooth pain.Bacteria growing inside your mouth can contribute to gum disease and dental decay, both of which can cause pain. A toothache occurs from inflammation of the central portion of the tooth called pulp. The pulp contains nerve endings that are very sensitive to pain. Inflammation to the pulp or pulpitis may be caused by dental cavities, trauma, and infection.    HOME CARE:   For toothaches: Over-the-counter pain medications such as acetaminophen or ibuprofen may be used. Take these as directed on the package while you arrange for a dental appointment. Avoid very cold or hot foods, because they may make the pain worse. You may get relief from biting on a cotton ball soaked in oil of cloves. You can get oil of cloves at most drug stores.  For jaw pain:  Aspirin may be helpful for problems in the joint of the jaw in adults. If pain happens every time you open your mouth widely, the temporomandibular joint (TMJ) may be the source of the pain. Yawning or taking a large bite of food may worsen the pain.  An appointment with your doctor or dentist will help you find the cause.     GET HELP RIGHT AWAY IF:  You have a high fever or chills If you have had a recent head or face injury and develop headache, light headedness, nausea, vomiting, or other symptoms that concern you after an injury to your face or mouth, you could have a more serious injury in addition to your dental injury. A facial rash associated with a toothache: This condition may improve with medication. Contact your doctor for them to decide what is appropriate. Any jaw pain occurring with chest pain: Although jaw pain is most commonly caused by dental disease, it is sometimes referred pain from other areas. People with heart disease, especially people who have had stents placed, people with diabetes, or those who have had heart surgery may have jaw pain as a symptom of heart attack or angina. If your jaw or tooth pain is associated with lightheadedness, sweating, or shortness of breath, you should see a doctor as soon as possible. Trouble swallowing or excessive pain or bleeding from gums: If you have a history of a weakened immune system, diabetes, or steroid use, you may be more susceptible to infections. Infections can often be more severe and extensive or caused by unusual organisms. Dental and gum infections in people with these conditions may require more aggressive treatment. An abscess may need draining or IV  antibiotics, for example.  MAKE SURE YOU   Understand these instructions. Will watch your condition. Will get help right away if you are not doing well or get worse.  Thank you for choosing an e-visit.  Your e-visit answers were reviewed by a board certified advanced clinical practitioner to complete your personal care plan. Depending upon the condition, your plan could have included both over the counter or prescription medications.  Please review your pharmacy choice. Make sure the pharmacy is open so you can pick up  prescription now. If there is a problem, you may contact your provider through [employer] and have the prescription routed to another pharmacy.  Your safety is important to [place]. If you have drug allergies check your prescription carefully.   For the next 24 hours you can use MyChart to ask questions about today's visit, request a non-urgent call back, or ask for a work or school excuse. You will get an email in the next two days asking about your experience. I hope that your e-visit has been valuable and will speed your recovery.  Approximately 5 minutes was used in reviewing the patient's chart, questionnaire, prescribing medications, and documentation.

## 2021-10-31 ENCOUNTER — Other Ambulatory Visit: Payer: Self-pay

## 2021-10-31 ENCOUNTER — Ambulatory Visit
Admission: RE | Admit: 2021-10-31 | Discharge: 2021-10-31 | Disposition: A | Discharge status: Home / Self Care | Payer: BC Managed Care – PPO | Source: Ambulatory Visit | Attending: Family Medicine | Admitting: Family Medicine

## 2021-10-31 ENCOUNTER — Other Ambulatory Visit (HOSPITAL_COMMUNITY): Payer: Self-pay

## 2021-10-31 DIAGNOSIS — R748 Abnormal levels of other serum enzymes: Secondary | ICD-10-CM | POA: Insufficient documentation

## 2021-10-31 DIAGNOSIS — K760 Fatty (change of) liver, not elsewhere classified: Secondary | ICD-10-CM | POA: Diagnosis not present

## 2021-10-31 DIAGNOSIS — R945 Abnormal results of liver function studies: Secondary | ICD-10-CM | POA: Diagnosis not present

## 2021-11-01 ENCOUNTER — Encounter: Payer: Self-pay | Admitting: Family Medicine

## 2021-11-01 DIAGNOSIS — K760 Fatty (change of) liver, not elsewhere classified: Secondary | ICD-10-CM | POA: Insufficient documentation

## 2021-11-02 ENCOUNTER — Telehealth: Payer: Self-pay

## 2021-11-02 NOTE — Telephone Encounter (Signed)
CALLED PATIENT NO ANSWER LEFT VOICEMAIL FOR A CALL BACK °Letter sent °

## 2021-12-27 ENCOUNTER — Ambulatory Visit (INDEPENDENT_AMBULATORY_CARE_PROVIDER_SITE_OTHER): Payer: BC Managed Care – PPO | Admitting: Gastroenterology

## 2021-12-27 VITALS — BP 133/81 | HR 48 | Temp 98.2°F | Ht 70.0 in | Wt 207.0 lb

## 2021-12-27 DIAGNOSIS — R748 Abnormal levels of other serum enzymes: Secondary | ICD-10-CM

## 2021-12-27 NOTE — Progress Notes (Signed)
? ? ?Gastroenterology Consultation ? ?Referring Provider:     Saralyn Pilar * ?Primary Care Physician:  Smitty Cords, DO ?Primary Gastroenterologist:  Dr. Servando Snare     ?Reason for Consultation:     Abnormal liver enzymes ?      ? HPI:   ?Jay Buchanan is a 41 y.o. y/o male referred for consultation & management of abnormal liver enzymes by Dr. Althea Charon, Netta Neat, DO.  This patient comes in today with a history of seeing me in the past for an upper endoscopy and colonoscopy.  The patient was noted to have abnormal liver enzymes.  The patient's liver enzymes have shown: ? ?Component ?    Latest Ref Rng 06/27/2017 09/16/2018 08/22/2020  ?Total Bilirubin ?    0.2 - 1.2 mg/dL 1.0  0.7  0.7   ?Alkaline phosphatase (APISO) ?    36 - 130 U/L 129 (H)  138 (H)  176 (H)   ?AST ?    10 - 40 U/L 31  28  45 (H)   ?ALT ?    9 - 46 U/L 61 (H)  55 (H)  79 (H)   ? ?Component ?    Latest Ref Rng 10/25/2021  ?Total Bilirubin ?    0.2 - 1.2 mg/dL 0.8   ?Alkaline phosphatase (APISO) ?    36 - 130 U/L 250 (H)   ?AST ?    10 - 40 U/L 54 (H)   ?ALT ?    9 - 46 U/L 101 (H)   ?  ?As can be seen from the labs the patient's liver enzymes have been elevated for some time.  The patient denies any alcohol intake.  He also denies any nausea vomiting fevers chills black stools bloody stools or high risk activity to explain the abnormal liver enzymes.  The patient does have an increased LDL at 127 total cholesterol 2:15.  His triglycerides have been normal. He denies taking any herbal medication. ? ? ?Past Medical History:  ?Diagnosis Date  ? Barrett's esophagus   ? GERD (gastroesophageal reflux disease)   ? Heart murmur   ? Hyperlipidemia   ? ? ?Past Surgical History:  ?Procedure Laterality Date  ? COLONOSCOPY WITH ESOPHAGOGASTRODUODENOSCOPY (EGD)  12/13/2014  ? Dr Servando Snare - [facility]  ? ESOPHAGOGASTRODUODENOSCOPY (EGD) WITH PROPOFOL N/A 05/21/2019  ? Procedure: ESOPHAGOGASTRODUODENOSCOPY (EGD) WITH PROPOFOL;  Surgeon: Midge Minium, MD;   Location: [facility];  Service: Endoscopy;  Laterality: N/A;  ? ? ?Prior to Admission medications   ?Medication Sig Start Date End Date Taking? Authorizing Provider  ?esomeprazole (NEXIUM) 40 MG capsule Take 1 capsule (40 mg total) by mouth daily before breakfast. 10/25/21   Althea Charon, Netta Neat, DO  ?penicillin v potassium (VEETID) 500 MG tablet Take 1 tablet (500 mg total) by mouth 4 (four) times daily. 10/27/21   Roxy Horseman, PA-C  ? ? ?Family History  ?Problem Relation Age of Onset  ? Diabetes Father   ? Cancer Father   ?     skin  ? Hypertension Father   ? Prostate cancer Father   ?     late 80s early 67s  ? Barrett's esophagus Father   ? Heart disease Father   ? Lung cancer Maternal Grandfather   ? Bladder Cancer Maternal Grandfather   ?  ? ?Social History  ? ?Tobacco Use  ? Smoking status: Never  ? Smokeless tobacco: Never  ?Vaping Use  ? Vaping Use: Never used  ?Substance Use Topics  ?  Alcohol use: No  ? Drug use: No  ? ? ?Allergies as of 12/27/2021  ? (No Known Allergies)  ? ? ?Review of Systems:    ?All systems reviewed and negative except where noted in HPI. ? ? Physical Exam:  ?There were no vitals taken for this visit. ?No LMP for male patient. ?General:   Alert,  Well-developed, well-nourished, pleasant and cooperative in NAD ?Head:  Normocephalic and atraumatic. ?Eyes:  Sclera clear, no icterus.   Conjunctiva pink. ?Ears:  Normal auditory acuity. ?Neck:  Supple; no masses or thyromegaly. ?Lungs:  Respirations even and unlabored.  Clear throughout to auscultation.   No wheezes, crackles, or rhonchi. No acute distress. ?Heart:  Regular rate and rhythm; no murmurs, clicks, rubs, or gallops. ?Abdomen:  Normal bowel sounds.  No bruits.  Soft, non-tender and non-distended without masses, hepatosplenomegaly or hernias noted.  No guarding or rebound tenderness.  Negative Carnett sign.   ?Rectal:  Deferred.  ?Pulses:  Normal pulses noted. ?Extremities:  No clubbing or edema.  No  cyanosis. ?Neurologic:  Alert and oriented x3;  grossly normal neurologically. ?Skin:  Intact without significant lesions or rashes.  No jaundice. ?Lymph Nodes:  No significant cervical adenopathy. ?Psych:  Alert and cooperative. Normal mood and affect. ? ?Imaging Studies: ?No results found. ? ?Assessment and Plan:  ? ?[name] is a 41 y.o. y/o male Comes in today with abnormal liver enzymes dating back to at least 3 years ago.  The patient Denies any risk factors for abnormal liver enzymes or hepatitis.  The patient will be set up for blood work to look for possible causes of his abnormal liver enzymes.  The patient did have an ultrasound that showed fatty liver.  The patient will be contacted with the results when they are obtained.  The patient has been spitting the plan and agrees with it. ? ? ? ?Midge Minium, MD. Clementeen Graham ? ? ? Note: This dictation was prepared with Dragon dictation along with smaller phrase technology. Any transcriptional errors that result from this process are unintentional.   ?

## 2021-12-28 LAB — IRON,TIBC AND FERRITIN PANEL
Ferritin: 160 ng/mL (ref 30–400)
Iron Saturation: 27 % (ref 15–55)
Iron: 106 ug/dL (ref 38–169)
Total Iron Binding Capacity: 391 ug/dL (ref 250–450)
UIBC: 285 ug/dL (ref 111–343)

## 2021-12-28 LAB — HEPATIC FUNCTION PANEL
ALT: 119 IU/L — ABNORMAL HIGH (ref 0–44)
AST: 79 IU/L — ABNORMAL HIGH (ref 0–40)
Albumin: 4.6 g/dL (ref 4.0–5.0)
Alkaline Phosphatase: 295 IU/L — ABNORMAL HIGH (ref 44–121)
Bilirubin Total: 1.3 mg/dL — ABNORMAL HIGH (ref 0.0–1.2)
Bilirubin, Direct: 0.4 mg/dL (ref 0.00–0.40)
Total Protein: 7.1 g/dL (ref 6.0–8.5)

## 2021-12-28 LAB — HEPATITIS B SURFACE ANTIGEN: Hepatitis B Surface Ag: NEGATIVE

## 2021-12-28 LAB — ANTI-SMOOTH MUSCLE ANTIBODY, IGG: Smooth Muscle Ab: 12 Units (ref 0–19)

## 2021-12-28 LAB — HEPATITIS B SURFACE ANTIBODY,QUALITATIVE: Hep B Surface Ab, Qual: NONREACTIVE

## 2021-12-28 LAB — ANA: Anti Nuclear Antibody (ANA): POSITIVE — AB

## 2021-12-28 LAB — CERULOPLASMIN: Ceruloplasmin: 30.3 mg/dL (ref 16.0–31.0)

## 2021-12-28 LAB — ALPHA-1-ANTITRYPSIN: A-1 Antitrypsin: 137 mg/dL (ref 101–187)

## 2021-12-28 LAB — HEPATITIS A ANTIBODY, TOTAL: hep A Total Ab: NEGATIVE

## 2021-12-28 LAB — MITOCHONDRIAL ANTIBODIES: Mitochondrial Ab: 161 Units — ABNORMAL HIGH (ref 0.0–20.0)

## 2022-01-01 ENCOUNTER — Encounter: Payer: Self-pay | Admitting: Gastroenterology

## 2022-01-02 ENCOUNTER — Telehealth: Payer: Self-pay | Admitting: Gastroenterology

## 2022-01-02 NOTE — Telephone Encounter (Signed)
Patients spouse left vm requesting a call back from [facility] about an appointment on May 1st.  ?

## 2022-01-02 NOTE — Telephone Encounter (Signed)
Appt scheduled 5/4

## 2022-01-11 ENCOUNTER — Encounter: Payer: Self-pay | Admitting: Gastroenterology

## 2022-01-11 ENCOUNTER — Ambulatory Visit (INDEPENDENT_AMBULATORY_CARE_PROVIDER_SITE_OTHER): Payer: BC Managed Care – PPO | Admitting: Gastroenterology

## 2022-01-11 VITALS — BP 121/79 | HR 51 | Temp 97.8°F | Wt 201.0 lb

## 2022-01-11 DIAGNOSIS — R748 Abnormal levels of other serum enzymes: Secondary | ICD-10-CM | POA: Diagnosis not present

## 2022-01-11 MED ORDER — URSODIOL 500 MG PO TABS
500.0000 mg | ORAL_TABLET | Freq: Three times a day (TID) | ORAL | 0 refills | Status: DC
Start: 2022-01-11 — End: 2023-02-07

## 2022-01-11 MED ORDER — URSODIOL 500 MG PO TABS: 500.0000 mg | ORAL_TABLET | Freq: Three times a day (TID) | ORAL | 3 refills | Status: DC

## 2022-01-11 NOTE — Progress Notes (Signed)
? ? ?  Primary Care Physician: Smitty Cords, DO ? ?Primary Gastroenterologist:  Dr. Midge Minium ? ?Chief Complaint  ?Patient presents with  ? Follow-up  ?  Discuss labs   ? ? ?HPI: Jay Buchanan is a 41 y.o. male here to review the labs.  The patient was found to have a very elevated AMA with continued elevation of his liver enzymes.  The patient also reports that his been itching for sometime. ? ?Past Medical History:  ?Diagnosis Date  ? Barrett's esophagus   ? GERD (gastroesophageal reflux disease)   ? Heart murmur   ? Hyperlipidemia   ? ? ?Current Outpatient Medications  ?Medication Sig Dispense Refill  ? esomeprazole (NEXIUM) 40 MG capsule Take 1 capsule (40 mg total) by mouth daily before breakfast. 90 capsule 3  ? ursodiol (URSO FORTE) 500 MG tablet Take 1 tablet (500 mg total) by mouth 3 (three) times daily. 270 tablet 3  ? ursodiol (URSO FORTE) 500 MG tablet Take 1 tablet (500 mg total) by mouth 3 (three) times daily. 90 tablet 0  ? ?No current facility-administered medications for this visit.  ? ? ?Allergies as of 01/11/2022  ? (No Known Allergies)  ? ? ?ROS: ? ?General: Negative for anorexia, weight loss, fever, chills, fatigue, weakness. ?ENT: Negative for hoarseness, difficulty swallowing , nasal congestion. ?CV: Negative for chest pain, angina, palpitations, dyspnea on exertion, peripheral edema.  ?Respiratory: Negative for dyspnea at rest, dyspnea on exertion, cough, sputum, wheezing.  ?GI: See history of present illness. ?GU:  Negative for dysuria, hematuria, urinary incontinence, urinary frequency, nocturnal urination.  ?Endo: Negative for unusual weight change.  ?  ?Physical Examination: ? ? BP 121/79   Pulse (!) 51   Temp 97.8 ?F (36.6 ?C) (Oral)   Wt 201 lb (91.2 kg)   BMI 28.84 kg/m?  ? ?General: Well-nourished, well-developed in no acute distress.  ?Eyes: No icterus. Conjunctivae pink. ?Neuro: Alert and oriented x 3.  Grossly intact. ?Skin: Warm and dry, no jaundice.   ?Psych:  Alert and cooperative, normal mood and affect. ? ?Labs:  ?  ?Imaging Studies: ?No results found. ? ?Assessment and Plan:  ? ?Jay Buchanan is a 42 y.o. y/o male who comes in with a elevated AMA and abnormal liver enzymes with itching.  The patient has been given the option of doing a liver biopsy or starting treatment with ursodeoxycholic acid to see if he improves.  The patient would like to do the ursodeoxycholic acid and has been given a prescription for 500 mg 3 times a day with a recommendation to have his repeat blood test done in a month to check of his liver enzymes are improving.  The patient has been explained the plan and agrees with it. ? ? ? ? ?Midge Minium, MD. Clementeen Graham ? ? ? Note: This dictation was prepared with Dragon dictation along with smaller phrase technology. Any transcriptional errors that result from this process are unintentional.  ?

## 2022-01-18 ENCOUNTER — Encounter: Payer: Self-pay | Admitting: Gastroenterology

## 2022-02-01 ENCOUNTER — Encounter: Payer: Self-pay | Admitting: Gastroenterology

## 2022-02-13 ENCOUNTER — Other Ambulatory Visit: Payer: Self-pay

## 2022-02-13 DIAGNOSIS — R748 Abnormal levels of other serum enzymes: Secondary | ICD-10-CM

## 2022-02-16 DIAGNOSIS — R748 Abnormal levels of other serum enzymes: Secondary | ICD-10-CM | POA: Diagnosis not present

## 2022-02-17 LAB — HEPATIC FUNCTION PANEL
AG Ratio: 1.6 (calc) (ref 1.0–2.5)
ALT: 45 U/L (ref 9–46)
AST: 28 U/L (ref 10–40)
Albumin: 4.2 g/dL (ref 3.6–5.1)
Alkaline phosphatase (APISO): 145 U/L — ABNORMAL HIGH (ref 36–130)
Bilirubin, Direct: 0.3 mg/dL — ABNORMAL HIGH (ref 0.0–0.2)
Globulin: 2.6 g/dL (calc) (ref 1.9–3.7)
Indirect Bilirubin: 0.7 mg/dL (calc) (ref 0.2–1.2)
Total Bilirubin: 1 mg/dL (ref 0.2–1.2)
Total Protein: 6.8 g/dL (ref 6.1–8.1)

## 2022-02-20 ENCOUNTER — Encounter: Payer: Self-pay | Admitting: Gastroenterology

## 2022-04-13 DIAGNOSIS — R748 Abnormal levels of other serum enzymes: Secondary | ICD-10-CM | POA: Diagnosis not present

## 2022-04-14 LAB — HEPATIC FUNCTION PANEL
AG Ratio: 1.7 (calc) (ref 1.0–2.5)
ALT: 39 U/L (ref 9–46)
AST: 29 U/L (ref 10–40)
Albumin: 4.4 g/dL (ref 3.6–5.1)
Alkaline phosphatase (APISO): 127 U/L (ref 36–130)
Bilirubin, Direct: 0.2 mg/dL (ref 0.0–0.2)
Globulin: 2.6 g/dL (calc) (ref 1.9–3.7)
Indirect Bilirubin: 0.6 mg/dL (calc) (ref 0.2–1.2)
Total Bilirubin: 0.8 mg/dL (ref 0.2–1.2)
Total Protein: 7 g/dL (ref 6.1–8.1)

## 2022-04-19 ENCOUNTER — Encounter: Payer: Self-pay | Admitting: Gastroenterology

## 2022-06-13 ENCOUNTER — Ambulatory Visit (INDEPENDENT_AMBULATORY_CARE_PROVIDER_SITE_OTHER): Payer: BC Managed Care – PPO

## 2022-06-13 DIAGNOSIS — Z23 Encounter for immunization: Secondary | ICD-10-CM | POA: Diagnosis not present

## 2022-08-26 ENCOUNTER — Other Ambulatory Visit: Payer: Self-pay | Admitting: Family Medicine

## 2022-08-26 DIAGNOSIS — K219 Gastro-esophageal reflux disease without esophagitis: Secondary | ICD-10-CM

## 2022-08-28 NOTE — Telephone Encounter (Signed)
Requested Prescriptions  Pending Prescriptions Disp Refills   esomeprazole (NEXIUM) 40 MG capsule [Pharmacy Med Name: Esomeprazole Magnesium 40 MG Oral Capsule Delayed Release] 90 capsule 3    Sig: TAKE 1 CAPSULE BY MOUTH DAILY  BEFORE BREAKFAST     Gastroenterology: Proton Pump Inhibitors 2 Passed - 08/26/2022 10:16 PM      Passed - ALT in normal range and within 360 days    ALT  Date Value Ref Range Status  04/13/2022 39 9 - 46 U/L Final         Passed - AST in normal range and within 360 days    AST  Date Value Ref Range Status  04/13/2022 29 10 - 40 U/L Final         Passed - Valid encounter within last 12 months    Recent Outpatient Visits           10 months ago Annual physical exam   [facility] Smitty Cords, DO   2 years ago Annual physical exam   [facility] Smitty Cords, DO   2 years ago Herpes zoster without complication   [facility] [place], Netta Neat, DO   3 years ago Annual physical exam   [facility] Smitty Cords, DO   4 years ago Acute non-recurrent maxillary sinusitis   [facility] [place], Netta Neat, [place]

## 2022-09-21 ENCOUNTER — Other Ambulatory Visit: Payer: Self-pay | Admitting: Family Medicine

## 2022-09-21 DIAGNOSIS — K219 Gastro-esophageal reflux disease without esophagitis: Secondary | ICD-10-CM

## 2022-11-05 ENCOUNTER — Encounter: Payer: Self-pay | Admitting: Gastroenterology

## 2022-11-06 ENCOUNTER — Encounter: Payer: Self-pay | Admitting: Family Medicine

## 2022-11-06 ENCOUNTER — Ambulatory Visit (INDEPENDENT_AMBULATORY_CARE_PROVIDER_SITE_OTHER): Payer: BC Managed Care – PPO | Admitting: Family Medicine

## 2022-11-06 VITALS — BP 118/80 | HR 57 | Ht 70.0 in | Wt 201.6 lb

## 2022-11-06 DIAGNOSIS — K743 Primary biliary cirrhosis: Secondary | ICD-10-CM | POA: Insufficient documentation

## 2022-11-06 DIAGNOSIS — E663 Overweight: Secondary | ICD-10-CM

## 2022-11-06 DIAGNOSIS — Z0000 Encounter for general adult medical examination without abnormal findings: Secondary | ICD-10-CM

## 2022-11-06 DIAGNOSIS — Z125 Encounter for screening for malignant neoplasm of prostate: Secondary | ICD-10-CM | POA: Diagnosis not present

## 2022-11-06 DIAGNOSIS — E7800 Pure hypercholesterolemia, unspecified: Secondary | ICD-10-CM

## 2022-11-06 DIAGNOSIS — R7309 Other abnormal glucose: Secondary | ICD-10-CM | POA: Diagnosis not present

## 2022-11-06 DIAGNOSIS — K219 Gastro-esophageal reflux disease without esophagitis: Secondary | ICD-10-CM

## 2022-11-06 DIAGNOSIS — B029 Zoster without complications: Secondary | ICD-10-CM

## 2022-11-06 MED ORDER — ESOMEPRAZOLE MAGNESIUM 40 MG PO CPDR: 40.0000 mg | DELAYED_RELEASE_CAPSULE | Freq: Every day | ORAL | 3 refills | Status: DC

## 2022-11-06 MED ORDER — VALACYCLOVIR HCL 1 G PO TABS: 1000.0000 mg | ORAL_TABLET | Freq: Three times a day (TID) | ORAL | 0 refills | Status: DC

## 2022-11-06 NOTE — Progress Notes (Signed)
Subjective:    Patient ID: Jay Buchanan, male    DOB: November 26, 1980, 42 y.o.   MRN: [id number]  Jay Buchanan is a 42 y.o. male presenting on 11/06/2022 for Annual Exam   HPI  Here for Annual Physical and Lab Review   HYPERLIPIDEMIA / Overweight BMI >28 Lifestyle:  Weight loss in past year, with exercise and diet regimen down to 190 lbs, and now regained some on vacation. Goal to keep improving. He admits best results with reduced intake late night. He will keep improving lifestyle Never been on any cholesterol medication Lifestyle Weight back up now, but improved overall now 201 lbs - Diet: Improving diet, mostly balanced, working on drinking more water - Exercise: Limited exercise, admits needs to improve, he keeps track of steps often up to 30k, sometimes only "activity" otherwise measures more cardio exercise at times   GERD / Barrett's Esophagus - Previously followed by Dr Allen Norris [facility], had EGD and Colonoscopy on 12/13/14, see chart for details, review with EGD for GERD and dx with esophagitis and found to have Barrett's esophagus. Repeat EGD done 05/2019 Dr Allen Norris [facility] - negative, no Barrett's, no precancer no further surveillance for while - He is doing very well on Nexium '40mg'$  daily, without any breakthrough symptoms     Right Arm Shingles flare Prior flare 2021 treated w valtrex shingles on chest Reports acute onset R arm shingles flare with some stinging burning pain thought maybe scraped arm and triggered the rash but it seems it is similar to prior shingles flare. Small patch some pain but not severe - Thought to be likely increased risk with autoimmune history Interested in vaccine in future  Suspected Primary Biliary Cholangitis Elevated LFTs Established with GI Dr Allen Norris in 10/2021, had US Liver w fatty liver, and enzymes elevated, diagnostic testing showed very elevated AMA lab. Consistent with autoimmune hepatitis, he was started on Ursodiol therapy. Repeat Labs done 02/2022, by GI,  with improved Liver Enzymes to AST 28 and ALT 45, still elevated alk phos but improved. Bilirubin Direct 0.3 slight elevation.   Health Maintenance: Last TDap approx 2011, before son was born for pertussis coverage   Colon CA Screening: Last Colonoscopy 12/2014 - (done by Dr Allen Norris [facility] [place]), results with no polyps, good for likely up to 10 years. Currently asymptomatic. No known family history of colon CA. Next due 2026   Prostate CA Screening: PSA negative 2023. Due yearly. Currently asymptomatic. Known family history of prostate CA w/ father age 52-60s.     10/25/2021    9:07 AM 08/22/2020    8:11 AM 09/16/2018    8:57 AM  Depression screen PHQ 2/9  Decreased Interest 0 0 0  Down, Depressed, Hopeless 0 0 0  PHQ - 2 Score 0 0 0  Altered sleeping 0    Tired, decreased energy 0    Change in appetite 0    Feeling bad or failure about yourself  0    Trouble concentrating 0    Moving slowly or fidgety/restless 0    Suicidal thoughts 0    PHQ-9 Score 0    Difficult doing work/chores Not difficult at all      Past Medical History:  Diagnosis Date   Barrett's esophagus    GERD (gastroesophageal reflux disease)    Heart murmur    Hyperlipidemia    Past Surgical History:  Procedure Laterality Date   COLONOSCOPY WITH ESOPHAGOGASTRODUODENOSCOPY (EGD)  12/13/2014   Dr Allen Norris [facility]  ESOPHAGOGASTRODUODENOSCOPY (EGD) WITH PROPOFOL N/A 05/21/2019   Procedure: ESOPHAGOGASTRODUODENOSCOPY (EGD) WITH PROPOFOL;  Surgeon: Lucilla Lame, MD;  Location: [place];  Service: Endoscopy;  Laterality: N/A;   Social History   Socioeconomic History   Marital status: Married    Spouse name: Nahiem Mahoney   Number of children: 2   Years of education: College   Highest education level: Not on file  Occupational History   Occupation: Advertising copywriter    Comment: Doctor, 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)  Tobacco Use   Smoking status: Never   Smokeless tobacco: Never  Vaping Use   Vaping  Use: Never used  Substance and Sexual Activity   Alcohol use: No   Drug use: No   Sexual activity: Not on file  Other Topics Concern   Not on file  Social History Narrative   Not on file   Social Determinants of Health   Financial Resource Strain: Not on file  Food Insecurity: Not on file  Transportation Needs: Not on file  Physical Activity: Not on file  Stress: Not on file  Social Connections: Not on file  Intimate Partner Violence: Not on file   Family History  Problem Relation Age of Onset   Diabetes Father    Cancer Father        skin   Hypertension Father    Prostate cancer Father        late 76s early 32s   Barrett's esophagus Father    Heart disease Father    Lung cancer Maternal Grandfather    Bladder Cancer Maternal Grandfather    Current Outpatient Medications on File Prior to Visit  Medication Sig   ursodiol (URSO FORTE) 500 MG tablet Take 1 tablet (500 mg total) by mouth 3 (three) times daily.   ursodiol (URSO FORTE) 500 MG tablet Take 1 tablet (500 mg total) by mouth 3 (three) times daily.   No current facility-administered medications on file prior to visit.    Review of Systems  Constitutional:  Negative for activity change, appetite change, chills, diaphoresis, fatigue and fever.  HENT:  Negative for congestion and hearing loss.   Eyes:  Negative for visual disturbance.  Respiratory:  Negative for cough, chest tightness, shortness of breath and wheezing.   Cardiovascular:  Negative for chest pain, palpitations and leg swelling.  Gastrointestinal:  Negative for abdominal pain, constipation, diarrhea, nausea and vomiting.  Genitourinary:  Negative for dysuria, frequency and hematuria.  Musculoskeletal:  Negative for arthralgias and neck pain.  Skin:  Negative for rash.  Neurological:  Negative for dizziness, weakness, light-headedness, numbness and headaches.  Hematological:  Negative for adenopathy.  Psychiatric/Behavioral:  Negative for behavioral  problems, dysphoric mood and sleep disturbance.    Per HPI unless specifically indicated above      Objective:    BP 118/80   Pulse (!) 57   Ht '5\' 10"'$  (1.778 m)   Wt 201 lb 9.6 oz (91.4 kg)   SpO2 99%   BMI 28.93 kg/m   Wt Readings from Last 3 Encounters:  11/06/22 201 lb 9.6 oz (91.4 kg)  01/11/22 201 lb (91.2 kg)  12/27/21 207 lb (93.9 kg)    Physical Exam Vitals and nursing note reviewed.  Constitutional:      General: He is not in acute distress.    Appearance: He is well-developed. He is not diaphoretic.     Comments: Well-appearing, comfortable, cooperative  HENT:     Head: Normocephalic and atraumatic.  Eyes:  General:        Right eye: No discharge.        Left eye: No discharge.     Conjunctiva/sclera: Conjunctivae normal.     Pupils: Pupils are equal, round, and reactive to light.  Neck:     Thyroid: No thyromegaly.     Vascular: No carotid bruit.  Cardiovascular:     Rate and Rhythm: Normal rate and regular rhythm.     Pulses: Normal pulses.     Heart sounds: Normal heart sounds. No murmur heard. Pulmonary:     Effort: Pulmonary effort is normal. No respiratory distress.     Breath sounds: Normal breath sounds. No wheezing or rales.  Abdominal:     General: Bowel sounds are normal. There is no distension.     Palpations: Abdomen is soft. There is no mass.     Tenderness: There is no abdominal tenderness.  Musculoskeletal:        General: No tenderness. Normal range of motion.     Cervical back: Normal range of motion and neck supple.     Right lower leg: No edema.     Left lower leg: No edema.     Comments: Upper / Lower Extremities: - Normal muscle tone, strength bilateral upper extremities 5/5, lower extremities 5/5  Lymphadenopathy:     Cervical: No cervical adenopathy.  Skin:    General: Skin is warm and dry.     Findings: Rash (R forearm shingles vesicular rash) present. No erythema or lesion.  Neurological:     Mental Status: He is  alert and oriented to person, place, and time.     Comments: Distal sensation intact to light touch all extremities  Psychiatric:        Mood and Affect: Mood normal.        Behavior: Behavior normal.        Thought Content: Thought content normal.     Comments: Well groomed, good eye contact, normal speech and thoughts     I have personally reviewed the radiology report from 11/01/21 on RUQ [place].  US Abdomen Limited RUQ (LIVER/GB) [id number] Resulted: 11/01/21 0234  Order Status: Completed Updated: 11/01/21 0237  Narrative:    CLINICAL DATA:  Elevated LFTs  EXAM: ULTRASOUND ABDOMEN LIMITED RIGHT UPPER QUADRANT  COMPARISON:  None.  FINDINGS: Gallbladder:  No gallstones or wall thickening visualized. No sonographic Murphy sign noted by sonographer.  Common bile duct:  Diameter: 3.2 mm  Liver:  Increased in echogenicity consistent with fatty infiltration. No focal mass is noted. Portal vein is patent on color Doppler imaging with normal direction of blood flow towards the liver.  Other: None.  IMPRESSION: Fatty liver.  No acute abnormality noted.   Electronically Signed   By: Inez Catalina M.D.   On: 11/01/2021 02:34    Results for orders placed or performed in visit on 02/13/22  Hepatic function panel  Result Value Ref Range   Total Protein 6.8 6.1 - 8.1 g/dL   Albumin 4.2 3.6 - 5.1 g/dL   Globulin 2.6 1.9 - 3.7 g/dL (calc)   AG Ratio 1.6 1.0 - 2.5 (calc)   Total Bilirubin 1.0 0.2 - 1.2 mg/dL   Bilirubin, Direct 0.3 (H) 0.0 - 0.2 mg/dL   Indirect Bilirubin 0.7 0.2 - 1.2 mg/dL (calc)   Alkaline phosphatase (APISO) 145 (H) 36 - 130 U/L   AST 28 10 - 40 U/L   ALT 45 9 - 46 U/L  Hepatic function panel  Result Value Ref Range   Total Protein 7.0 6.1 - 8.1 g/dL   Albumin 4.4 3.6 - 5.1 g/dL   Globulin 2.6 1.9 - 3.7 g/dL (calc)   AG Ratio 1.7 1.0 - 2.5 (calc)   Total Bilirubin 0.8 0.2 - 1.2 mg/dL   Bilirubin, Direct 0.2 0.0 - 0.2 mg/dL   Indirect Bilirubin  0.6 0.2 - 1.2 mg/dL (calc)   Alkaline phosphatase (APISO) 127 36 - 130 U/L   AST 29 10 - 40 U/L   ALT 39 9 - 46 U/L      Assessment & Plan:   Problem List Items Addressed This Visit     GERD (gastroesophageal reflux disease)   Relevant Medications   esomeprazole (NEXIUM) 40 MG capsule   Hyperlipidemia   Relevant Orders   Lipid panel   TSH   Overweight (BMI 25.0-29.9)   Other Visit Diagnoses     Annual physical exam    -  Primary   Relevant Orders   CBC with Differential/Platelet   Lipid panel   Hemoglobin A1c   Abnormal glucose       Relevant Orders   Hemoglobin A1c   Screening for prostate cancer       Relevant Orders   PSA   Primary biliary cholangitis (HCC)       Relevant Orders   CBC with Differential/Platelet   BASIC METABOLIC PANEL WITH GFR   Hepatic function panel   Herpes zoster without complication       Relevant Medications   valACYclovir (VALTREX) 1000 MG tablet       Updated Health Maintenance information Fasting lab orders today, pending Encouraged improvement to lifestyle with diet and exercise Goal of weight loss  #Suspected PBC Fatty Liver on last RUQ Liver US On Ursodiol for symptom management. Route labs to Dr Allen Norris for review, requested hepatic function panel every 6 months. He will return to see Dr Allen Norris in approximately 3 months, May 2024  #Hyperlipidemia Fasting labs, pending. Encourage lifestyle management  #GERD/ Hx Barretts Controlled currently Keep on generic Nexium '40mg'$  daily. Refills sent  #Shingles, acute flare Uncomplicated, R forearm Localized, no systemic concerns 3rd shingles flare total No prior vaccine Treat with Valtrex today THREE TIMES A DAY 7 days. Defer Gabapentin was not effective. He should be eligible age < 90 for Shingrix vaccine as immunocompromised patient with autoimmune condition. He should wait 6 weeks until after current shingles flare before pursuing Shingrix vaccine.  Meds ordered this encounter   Medications   valACYclovir (VALTREX) 1000 MG tablet    Sig: Take 1 tablet (1,000 mg total) by mouth 3 (three) times daily. For shingles    Dispense:  21 tablet    Refill:  0   esomeprazole (NEXIUM) 40 MG capsule    Sig: Take 1 capsule (40 mg total) by mouth daily before breakfast.    Dispense:  90 capsule    Refill:  3    Please send a replace/new response with 90-Day Supply if appropriate to maximize member benefit. Requesting 1 year supply.     Follow up plan: Return in about 6 months (around 05/07/2023) for 6 month lab only then 1 year for Annual Physical AM apt lab after.  Future labs 6 months Hepatic Function Panel only 04/2023. Can order when ready.  Nobie Putnam, [facility] 11/06/2022, 9:35 AM

## 2022-11-06 NOTE — Patient Instructions (Addendum)
Thank you for coming to the office today.  Labs ordered today, stay tuned for results on MyChart, this includes the complete metabolic with Hepatic Liver Function Panel.  I will route these results to Dr Allen Norris for his review. And it looks like you are next due to see him in May 2024. Please call or check back with his office closer to May  R arm, seems like mild shingles flare  Start Valtrex for 7 days  Refills for future year sent for Nexium to [facility]  Please schedule a Follow-up Appointment to: Return in about 6 months (around 05/07/2023) for 6 month lab only then 1 year for Annual Physical AM apt lab after.  If you have any other questions or concerns, please feel free to call the office or send a message through [place]. You may also schedule an earlier appointment if necessary.  Additionally, you may be receiving a survey about your experience at our office within a few days to 1 week by e-mail or mail. We value your feedback.  Nobie Putnam, DO [facility]

## 2022-11-07 LAB — CBC WITH DIFFERENTIAL/PLATELET
Absolute Monocytes: 534 cells/uL (ref 200–950)
Basophils Absolute: 42 cells/uL (ref 0–200)
Basophils Relative: 0.7 %
Eosinophils Absolute: 168 cells/uL (ref 15–500)
Eosinophils Relative: 2.8 %
HCT: 45.2 % (ref 38.5–50.0)
Hemoglobin: 16 g/dL (ref 13.2–17.1)
Lymphs Abs: 2238 cells/uL (ref 850–3900)
MCH: 30.4 pg (ref 27.0–33.0)
MCHC: 35.4 g/dL (ref 32.0–36.0)
MCV: 85.8 fL (ref 80.0–100.0)
MPV: 9.8 fL (ref 7.5–12.5)
Monocytes Relative: 8.9 %
Neutro Abs: 3018 cells/uL (ref 1500–7800)
Neutrophils Relative %: 50.3 %
Platelets: 313 10*3/uL (ref 140–400)
RBC: 5.27 10*6/uL (ref 4.20–5.80)
RDW: 12.2 % (ref 11.0–15.0)
Total Lymphocyte: 37.3 %
WBC: 6 10*3/uL (ref 3.8–10.8)

## 2022-11-07 LAB — LIPID PANEL
Cholesterol: 229 mg/dL — ABNORMAL HIGH (ref <200)
HDL: 93 mg/dL (ref >=40)
LDL Cholesterol (Calc): 122 mg/dL (calc) — ABNORMAL HIGH
Non-HDL Cholesterol (Calc): 136 mg/dL (calc) — ABNORMAL HIGH (ref <130)
Total CHOL/HDL Ratio: 2.5 (calc) (ref <5.0)
Triglycerides: 57 mg/dL (ref <150)

## 2022-11-07 LAB — PSA: PSA: 0.49 ng/mL (ref <=4.00)

## 2022-11-07 LAB — HEPATIC FUNCTION PANEL
AG Ratio: 1.7 (calc) (ref 1.0–2.5)
ALT: 46 U/L (ref 9–46)
AST: 32 U/L (ref 10–40)
Albumin: 4.8 g/dL (ref 3.6–5.1)
Alkaline phosphatase (APISO): 157 U/L — ABNORMAL HIGH (ref 36–130)
Bilirubin, Direct: 0.2 mg/dL (ref 0.0–0.2)
Globulin: 2.8 g/dL (calc) (ref 1.9–3.7)
Indirect Bilirubin: 0.8 mg/dL (calc) (ref 0.2–1.2)
Total Bilirubin: 1 mg/dL (ref 0.2–1.2)
Total Protein: 7.6 g/dL (ref 6.1–8.1)

## 2022-11-07 LAB — HEMOGLOBIN A1C
Hgb A1c MFr Bld: 5.6 % of total Hgb (ref <5.7)
Mean Plasma Glucose: 114 mg/dL
eAG (mmol/L): 6.3 mmol/L

## 2022-11-07 LAB — BASIC METABOLIC PANEL WITH GFR
BUN: 24 mg/dL (ref 7–25)
CO2: 29 mmol/L (ref 20–32)
Calcium: 10 mg/dL (ref 8.6–10.3)
Chloride: 102 mmol/L (ref 98–110)
Creat: 0.85 mg/dL (ref 0.60–1.29)
Glucose, Bld: 104 mg/dL — ABNORMAL HIGH (ref 65–99)
Potassium: 5.1 mmol/L (ref 3.5–5.3)
Sodium: 139 mmol/L (ref 135–146)
eGFR: 111 mL/min/{1.73_m2} (ref >=60)

## 2022-11-07 LAB — TSH: TSH: 1.21 mIU/L (ref 0.40–4.50)

## 2022-11-13 ENCOUNTER — Encounter: Payer: Self-pay | Admitting: Family Medicine

## 2022-11-13 DIAGNOSIS — B029 Zoster without complications: Secondary | ICD-10-CM

## 2022-11-14 MED ORDER — VALACYCLOVIR HCL 1 G PO TABS: 1000.0000 mg | ORAL_TABLET | Freq: Three times a day (TID) | ORAL | 0 refills | Status: DC

## 2022-11-22 ENCOUNTER — Other Ambulatory Visit: Payer: Self-pay | Admitting: Gastroenterology

## 2023-02-07 ENCOUNTER — Telehealth (INDEPENDENT_AMBULATORY_CARE_PROVIDER_SITE_OTHER): Payer: BC Managed Care – PPO | Admitting: Gastroenterology

## 2023-02-07 DIAGNOSIS — R748 Abnormal levels of other serum enzymes: Secondary | ICD-10-CM | POA: Diagnosis not present

## 2023-02-07 DIAGNOSIS — K743 Primary biliary cirrhosis: Secondary | ICD-10-CM

## 2023-02-07 NOTE — Progress Notes (Signed)
Jay Minium, MD [address]  [address]  [place], [place] [address]  Main: [phone]  Fax: [phone]    Gastroenterology Virtual/Video Visit  Referring Provider:     Saralyn Pilar * Primary Care Physician:  Smitty Cords, DO Primary Gastroenterologist:  Dr.Owais Pruett Servando Snare Reason for Consultation:     Abnormal liver enzymes        HPI:    Virtual Visit via Video Note Location of the patient: Home Location of provider: Office Participating persons: The patient his wife and myself.  I connected with Jay Buchanan on 02/07/23 at  3:30 PM EDT by a video enabled telemedicine application and verified that I am speaking with the correct person using two identifiers.   I discussed the limitations of evaluation and management by telemedicine and the availability of in person appointments. The patient expressed understanding and agreed to proceed.  Verbal consent to proceed obtained.  History of Present Illness: Jay Buchanan is a 42 y.o. male referred by Dr. Althea Charon, Netta Neat, DO  for consultation & management of abnormal liver enzymes.  This patient was started on Urso deoxycholic acid for possible PBC.  The patient had a positive AMA with abnormal liver enzymes.  The patient was started on Urso deoxycholic acid and the liver enzymes actually came back to normal with the most recent liver enzymes showing a solitary increase in his alkaline phosphatase.  That was done back in [place].  The patient states that he has no further itching.  Past Medical History:  Diagnosis Date   Barrett's esophagus    GERD (gastroesophageal reflux disease)    Heart murmur    Hyperlipidemia     Past Surgical History:  Procedure Laterality Date   COLONOSCOPY WITH ESOPHAGOGASTRODUODENOSCOPY (EGD)  12/13/2014   Dr Servando Snare - [facility]   ESOPHAGOGASTRODUODENOSCOPY (EGD) WITH PROPOFOL N/A 05/21/2019   Procedure: ESOPHAGOGASTRODUODENOSCOPY (EGD) WITH PROPOFOL;  Surgeon: Jay Minium,  MD;  [facility];  Service: Endoscopy;  Laterality: N/A;    Prior to Admission medications   Medication Sig Start Date End Date Taking? Authorizing Provider  esomeprazole (NEXIUM) 40 MG capsule Take 1 capsule (40 mg total) by mouth daily before breakfast. 11/06/22  Yes Karamalegos, Netta Neat, DO  ursodiol (ACTIGALL) 500 MG tablet TAKE 1 TABLET BY MOUTH 3 TIMES  DAILY 11/22/22  Yes Jay Minium, MD    Family History  Problem Relation Age of Onset   Diabetes Father    Cancer Father        skin   Hypertension Father    Prostate cancer Father        late 23s early 64s   Barrett's esophagus Father    Heart disease Father    Lung cancer Maternal Grandfather    Bladder Cancer Maternal Grandfather      Social History   Tobacco Use   Smoking status: Never   Smokeless tobacco: Never  Vaping Use   Vaping Use: Never used  Substance Use Topics   Alcohol use: No   Drug use: No    Allergies as of 02/07/2023   (No Known Allergies)    Review of Systems:    All systems reviewed and negative except where noted in HPI.   Observations/Objective:  Labs: CBC    Component Value Date/Time   WBC 6.0 11/06/2022 1009   RBC 5.27 11/06/2022 1009   HGB 16.0 11/06/2022 1009   HCT 45.2 11/06/2022 1009   PLT 313 11/06/2022 1009   MCV  85.8 11/06/2022 1009   MCH 30.4 11/06/2022 1009   MCHC 35.4 11/06/2022 1009   RDW 12.2 11/06/2022 1009   LYMPHSABS 2,238 11/06/2022 1009   EOSABS 168 11/06/2022 1009   BASOSABS 42 11/06/2022 1009   CMP     Component Value Date/Time   NA 139 11/06/2022 1009   NA 139 04/13/2014 0000   K 5.1 11/06/2022 1009   CL 102 11/06/2022 1009   CO2 29 11/06/2022 1009   GLUCOSE 104 (H) 11/06/2022 1009   BUN 24 11/06/2022 1009   BUN 17 04/13/2014 0000   CREATININE 0.85 11/06/2022 1009   CALCIUM 10.0 11/06/2022 1009   PROT 7.6 11/06/2022 1009   PROT 7.1 12/27/2021 1052   ALBUMIN 4.6 12/27/2021 1052   AST 32 11/06/2022 1009   ALT 46  11/06/2022 1009   ALKPHOS 295 (H) 12/27/2021 1052   BILITOT 1.0 11/06/2022 1009   BILITOT 1.3 (H) 12/27/2021 1052   GFRNONAA 106 08/22/2020 0837   GFRAA 123 08/22/2020 0837    Imaging Studies: No results found.  Assessment and Plan:   Jay Buchanan is a 42 y.o. y/o male has been referred for continued care of possible PBC.  The patient's alkaline phosphatase was elevated and February and we are going to have it rechecked.  If it continues to be elevated then the patient may need to have his medication changed or added to.  The patient will be contacted with the results.  Follow Up Instructions:  I discussed the assessment and treatment plan with the patient. The patient was provided an opportunity to ask questions and all were answered. The patient agreed with the plan and demonstrated an understanding of the instructions.   The patient was advised to call back or seek an in-person evaluation if the symptoms worsen or if the condition fails to improve as anticipated.  I provided 25 minutes of non-face-to-face time during this encounter including chart review In preparation for the encounter.   Jay Minium, MD  Speech recognition software was used to dictate the above note.

## 2023-02-08 ENCOUNTER — Encounter: Payer: Self-pay | Admitting: Gastroenterology

## 2023-02-08 DIAGNOSIS — K743 Primary biliary cirrhosis: Secondary | ICD-10-CM

## 2023-02-08 NOTE — Addendum Note (Signed)
Addended by: Roena Malady on: 02/08/2023 09:14 AM   Modules accepted: Orders

## 2023-02-11 NOTE — Addendum Note (Signed)
Addended by: Roena Malady on: 02/11/2023 08:53 AM   Modules accepted: Orders

## 2023-02-27 ENCOUNTER — Other Ambulatory Visit: Payer: BC Managed Care – PPO

## 2023-02-27 DIAGNOSIS — K743 Primary biliary cirrhosis: Secondary | ICD-10-CM | POA: Diagnosis not present

## 2023-02-28 ENCOUNTER — Encounter: Payer: Self-pay | Admitting: Gastroenterology

## 2023-02-28 LAB — HEPATIC FUNCTION PANEL
AG Ratio: 1.8 (calc) (ref 1.0–2.5)
ALT: 36 U/L (ref 9–46)
AST: 27 U/L (ref 10–40)
Albumin: 4.4 g/dL (ref 3.6–5.1)
Alkaline phosphatase (APISO): 129 U/L (ref 36–130)
Bilirubin, Direct: 0.2 mg/dL (ref 0.0–0.2)
Globulin: 2.5 g/dL (calc) (ref 1.9–3.7)
Indirect Bilirubin: 0.9 mg/dL (calc) (ref 0.2–1.2)
Total Bilirubin: 1.1 mg/dL (ref 0.2–1.2)
Total Protein: 6.9 g/dL (ref 6.1–8.1)

## 2023-03-19 ENCOUNTER — Encounter (INDEPENDENT_AMBULATORY_CARE_PROVIDER_SITE_OTHER): Payer: BC Managed Care – PPO | Admitting: Family Medicine

## 2023-03-19 DIAGNOSIS — B029 Zoster without complications: Secondary | ICD-10-CM

## 2023-03-19 MED ORDER — VALACYCLOVIR HCL 1 G PO TABS
1000.0000 mg | ORAL_TABLET | Freq: Three times a day (TID) | ORAL | 0 refills | Status: DC
Start: 2023-03-19 — End: 2023-11-08

## 2023-03-19 NOTE — Telephone Encounter (Signed)

## 2023-04-24 IMAGING — US US ABDOMEN LIMITED
1 series · 14 of 25 positions shown · non-contrast
Comparison: None.

CLINICAL DATA: Elevated LFTs

EXAM:
ULTRASOUND ABDOMEN LIMITED RIGHT UPPER QUADRANT

[Series 1: us abdomen limited · 0.20mm/px · 14 of 40 slices shown]
[im 1/40]
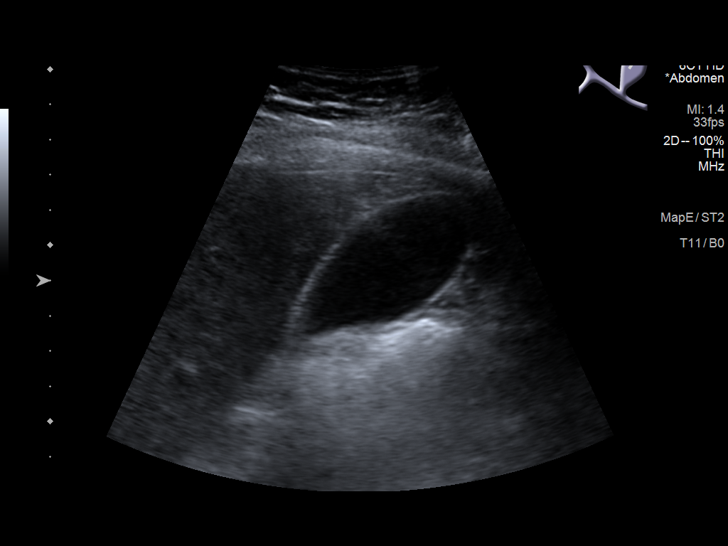
[im 4/40]
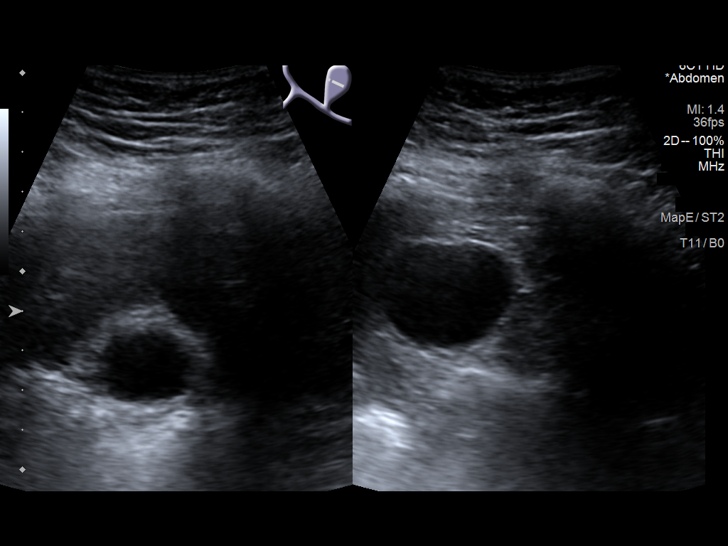
[im 7/40]
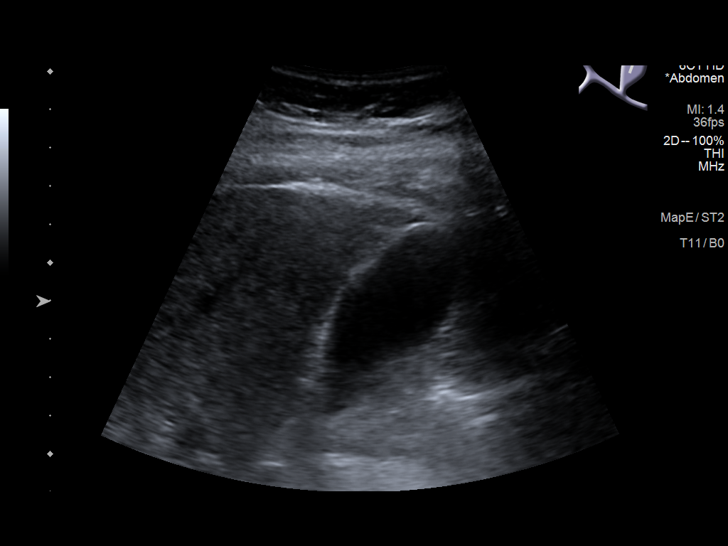
[im 10/40]
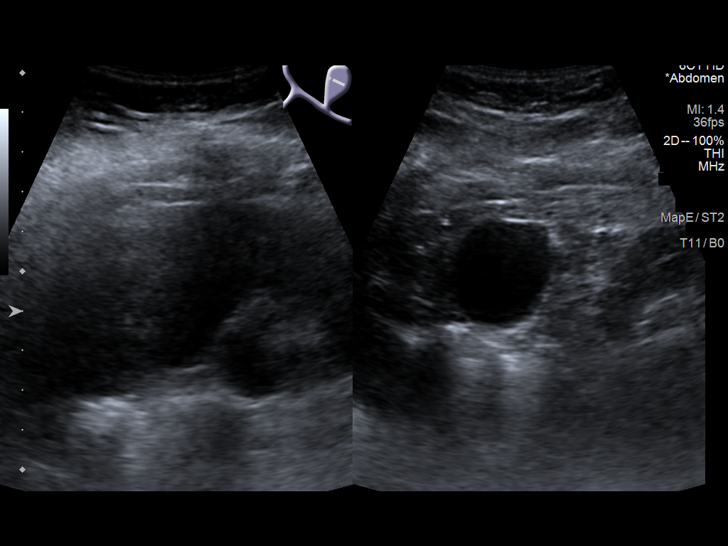
[im 14/40]
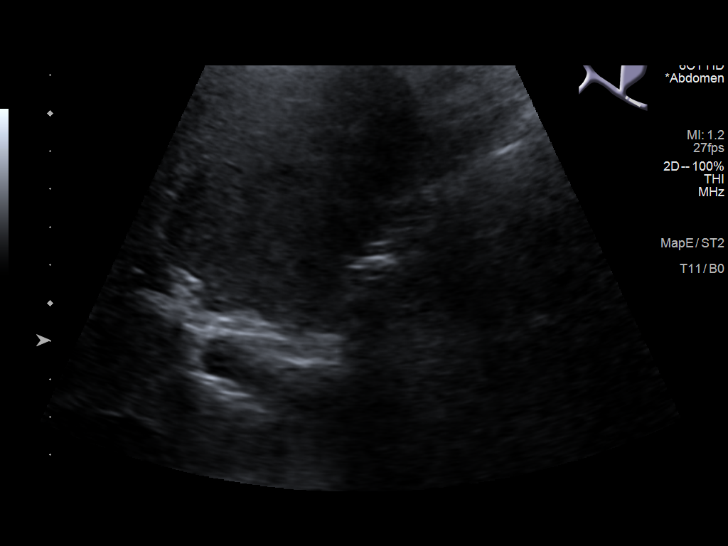
[im 15/40]
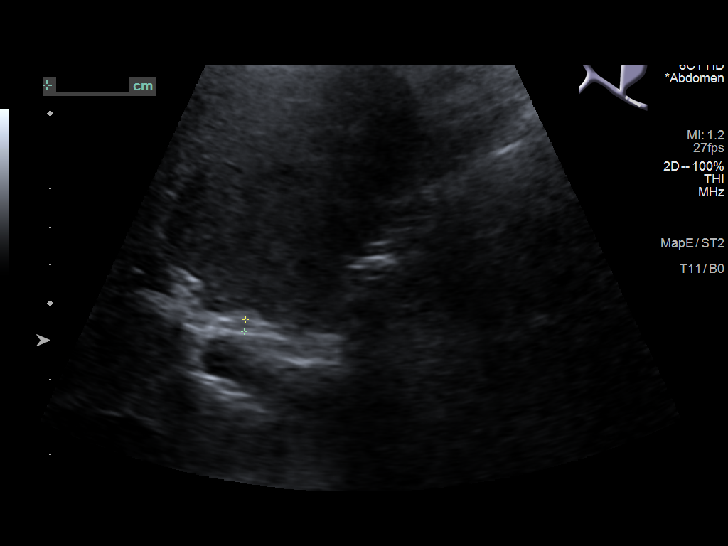
[im 18/40]
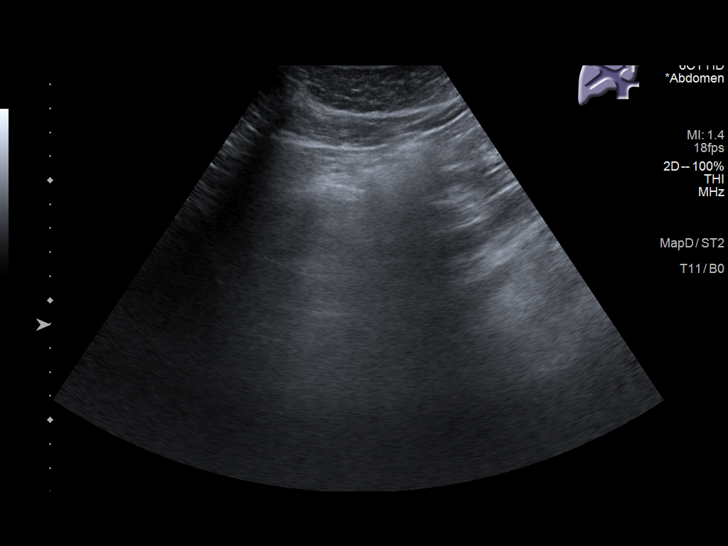
[im 22/40]
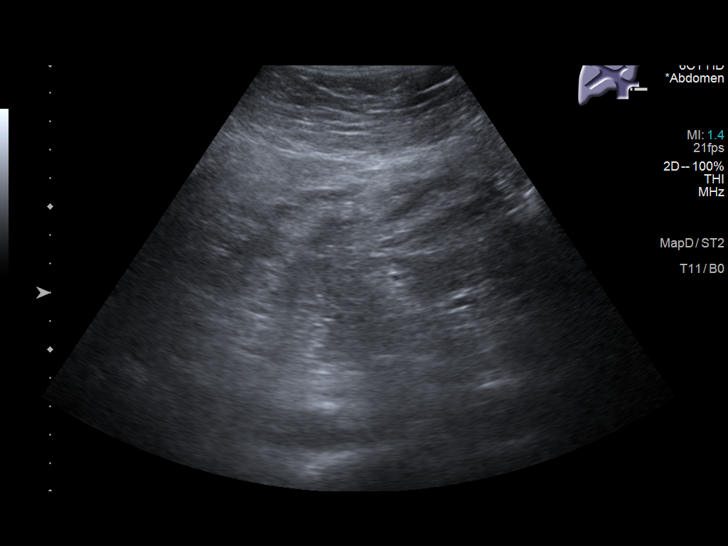
[im 25/40]
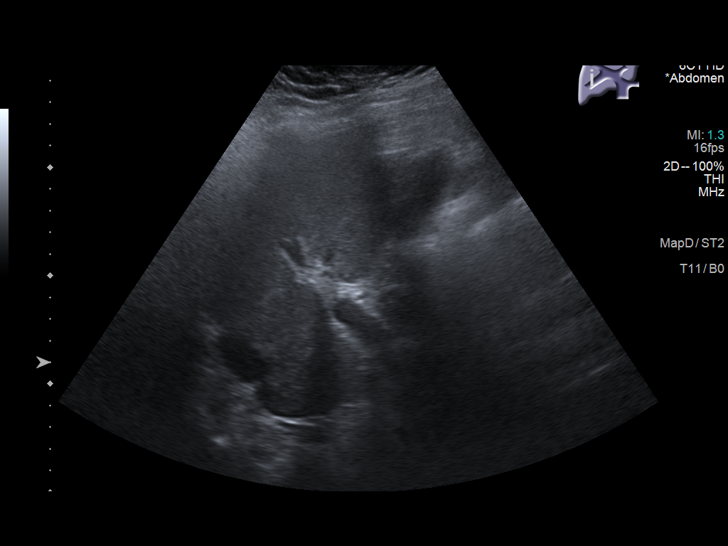
[im 27/40]
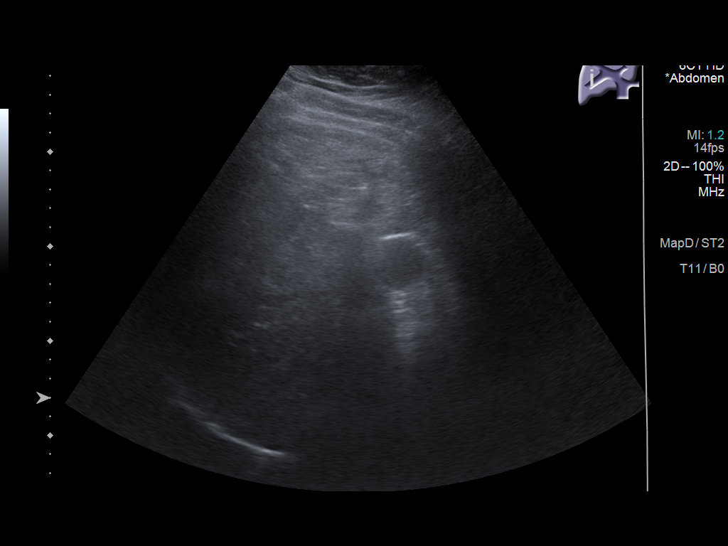
[im 30/40]
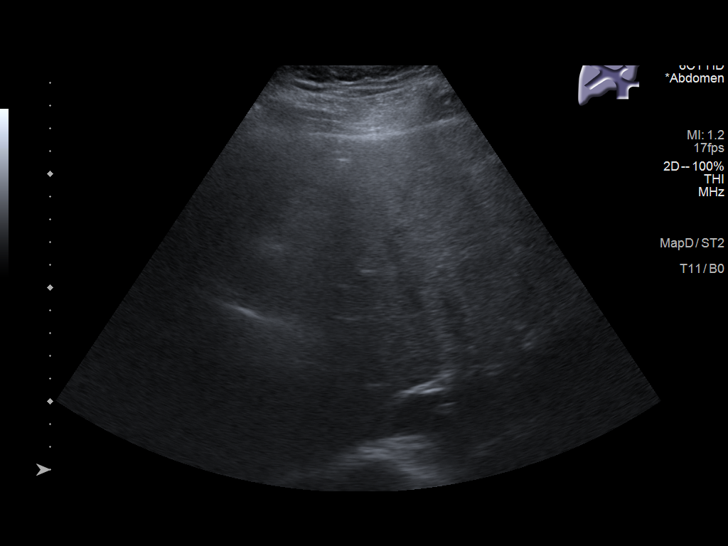
[im 33/40]
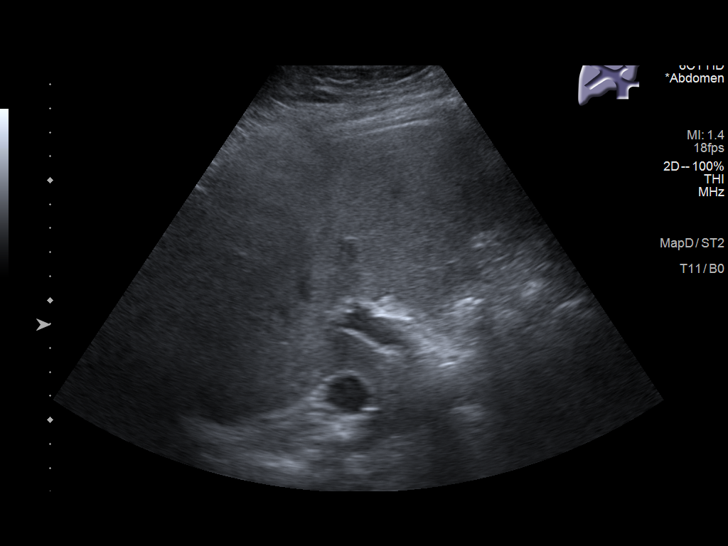
[im 36/40]
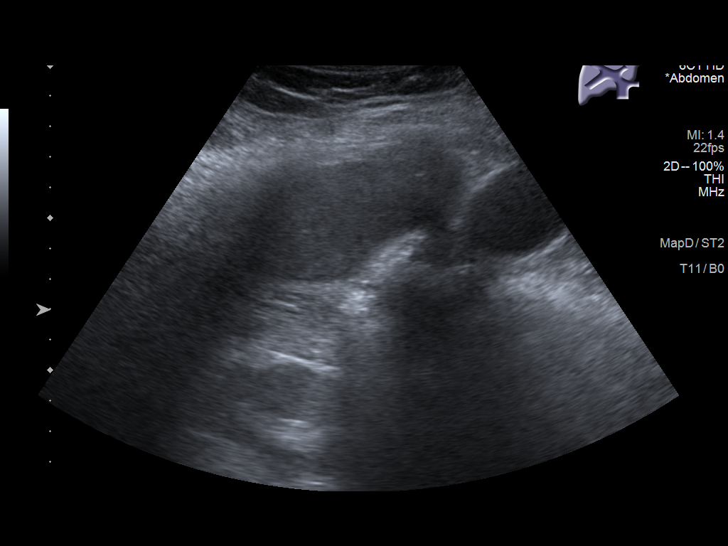
[im 40/40]
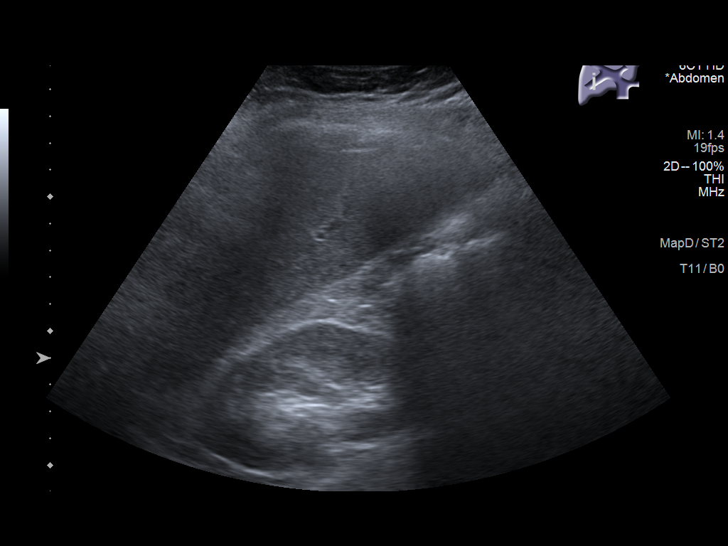

[14 of 25 positions shown; findings below may reference images not displayed]

FINDINGS: Gallbladder:

No gallstones or wall thickening visualized. No sonographic Murphy
sign noted by sonographer.

Common bile duct:

Diameter: 3.2 mm

Liver:

Increased in echogenicity consistent with fatty infiltration. No
focal mass is noted. Portal vein is patent on color Doppler imaging
with normal direction of blood flow towards the liver.

Other: None.
IMPRESSION: Fatty liver.  No acute abnormality noted.

## 2023-07-11 ENCOUNTER — Ambulatory Visit (INDEPENDENT_AMBULATORY_CARE_PROVIDER_SITE_OTHER): Payer: BC Managed Care – PPO

## 2023-07-11 DIAGNOSIS — Z23 Encounter for immunization: Secondary | ICD-10-CM

## 2023-08-02 ENCOUNTER — Other Ambulatory Visit: Payer: Self-pay | Admitting: Gastroenterology

## 2023-10-18 ENCOUNTER — Other Ambulatory Visit: Payer: Self-pay | Admitting: Family Medicine

## 2023-10-18 DIAGNOSIS — K219 Gastro-esophageal reflux disease without esophagitis: Secondary | ICD-10-CM

## 2023-10-21 NOTE — Telephone Encounter (Signed)
 Requested Prescriptions  Pending Prescriptions Disp Refills   esomeprazole  (NEXIUM ) 40 MG capsule [Pharmacy Med Name: Esomeprazole  Magnesium  40 MG Oral Capsule Delayed Release] 90 capsule 1    Sig: TAKE 1 CAPSULE BY MOUTH DAILY  BEFORE BREAKFAST     Gastroenterology: Proton Pump Inhibitors 2 Passed - 10/21/2023 11:14 AM      Passed - ALT in normal range and within 360 days    ALT  Date Value Ref Range Status  02/27/2023 36 9 - 46 U/L Final         Passed - AST in normal range and within 360 days    AST  Date Value Ref Range Status  02/27/2023 27 10 - 40 U/L Final         Passed - Valid encounter within last 12 months    Recent Outpatient Visits           11 months ago Annual physical exam   [facility] Raina Bunting, DO   1 year ago Annual physical exam   [facility] Raina Bunting, DO   3 years ago Annual physical exam   [facility] Raina Bunting, DO   3 years ago Herpes zoster without complication   [facility] [place], Kayleen Party, DO   5 years ago Annual physical exam    [facility] [place], Kayleen Party, [place]

## 2023-11-08 ENCOUNTER — Encounter: Payer: Self-pay | Admitting: Family Medicine

## 2023-11-08 ENCOUNTER — Ambulatory Visit (INDEPENDENT_AMBULATORY_CARE_PROVIDER_SITE_OTHER): Payer: BC Managed Care – PPO | Admitting: Family Medicine

## 2023-11-08 VITALS — BP 118/70 | HR 66 | Ht 70.0 in | Wt 209.0 lb

## 2023-11-08 DIAGNOSIS — Z0000 Encounter for general adult medical examination without abnormal findings: Secondary | ICD-10-CM

## 2023-11-08 DIAGNOSIS — B029 Zoster without complications: Secondary | ICD-10-CM

## 2023-11-08 DIAGNOSIS — E7800 Pure hypercholesterolemia, unspecified: Secondary | ICD-10-CM | POA: Diagnosis not present

## 2023-11-08 DIAGNOSIS — E663 Overweight: Secondary | ICD-10-CM

## 2023-11-08 DIAGNOSIS — R7309 Other abnormal glucose: Secondary | ICD-10-CM | POA: Diagnosis not present

## 2023-11-08 DIAGNOSIS — K743 Primary biliary cirrhosis: Secondary | ICD-10-CM | POA: Diagnosis not present

## 2023-11-08 DIAGNOSIS — Z125 Encounter for screening for malignant neoplasm of prostate: Secondary | ICD-10-CM

## 2023-11-08 MED ORDER — VALACYCLOVIR HCL 1 G PO TABS
1000.0000 mg | ORAL_TABLET | Freq: Three times a day (TID) | ORAL | 0 refills | Status: DC
Start: 2023-11-08 — End: 2024-07-30

## 2023-11-08 NOTE — Progress Notes (Signed)
 Subjective:    Patient ID: Jay Buchanan, male    DOB: Jun 19, 1981, 43 y.o.   MRN: [id number]  Jay Buchanan is a 43 y.o. male presenting on 11/08/2023 for Annual Exam   HPI  Discussed the use of AI scribe software for clinical note transcription with the patient, who gave verbal consent to proceed.  History of Present Illness   Jay Eugene "Susy Frizzle" is a 43 year old male who presents for an annual physical exam.  Recurrent Shingles, Right upper ext Prior flares since 2021. Possible link to autoimmune history He has a history of shingles, with a recent episode occurring about a month ago. He noticed a red patch on his skin, which was pointed out by his daughter, followed by burning pain the next day. He took leftover Valtrex, which helped alleviate the symptoms. The shingles tend to recur in the same area, and he attributes recent stress and overwork as potential triggers.  BMI >29 Hyperlipidemia LDL His weight fluctuates between 205 to 210 pounds, and he acknowledges that his diet could be improved despite getting enough exercise. He is not currently on cholesterol medication but is monitoring his levels, which were last noted to be mild at 122.  He mentions a family history of heart problems, specifically his father, but attributes it more to lifestyle factors such as smoking rather than genetic predisposition.     Goal to keep improving. He admits best results with reduced intake late night. He will keep improving lifestyle Never been on any cholesterol medication  GERD / Barrett's Esophagus Previously followed by Dr Servando Snare GI Repeat EGD done 05/2019 Dr Servando Snare AGI - negative, no Barrett's, no precancer no further surveillance for while - He is doing very well on Nexium 40mg  daily, without any breakthrough symptoms  Suspected Primary Biliary Cholangitis History of Elevated LFTs Established with GI Dr Servando Snare in 10/2021, had US Liver w fatty liver, and enzymes elevated, diagnostic testing  showed very elevated AMA lab. Consistent with autoimmune hepatitis, he was started on Ursodiol therapy. Last labs normalized LFTs Due now     Health Maintenance:   Colon CA Screening - prior colonoscopy done diagnostic 12/2014 Comment: Hematochezia > dx Anal Fissure, no polyps (Dr Servando Snare GI)     Next due age 30+       11/08/2023    8:43 AM 10/25/2021    9:07 AM 08/22/2020    8:11 AM  Depression screen PHQ 2/9  Decreased Interest 0 0 0  Down, Depressed, Hopeless 0 0 0  PHQ - 2 Score 0 0 0  Altered sleeping  0   Tired, decreased energy  0   Change in appetite  0   Feeling bad or failure about yourself   0   Trouble concentrating  0   Moving slowly or fidgety/restless  0   Suicidal thoughts  0   PHQ-9 Score  0   Difficult doing work/chores  Not difficult at all        11/08/2023    8:43 AM 10/25/2021    9:07 AM  GAD 7 : Generalized Anxiety Score  Nervous, Anxious, on Edge 0 0  Control/stop worrying 0 0  Worry too much - different things 0 0  Trouble relaxing 0 0  Restless 0 0  Easily annoyed or irritable 0 0  Afraid - awful might happen 0 0  Total GAD 7 Score 0 0  Anxiety Difficulty  Not difficult at all     Past Medical History:  Diagnosis Date   Barrett's esophagus    GERD (gastroesophageal reflux disease)    Heart murmur    Hyperlipidemia    Past Surgical History:  Procedure Laterality Date   COLONOSCOPY WITH ESOPHAGOGASTRODUODENOSCOPY (EGD)  12/13/2014   Dr Servando Snare - [facility]   ESOPHAGOGASTRODUODENOSCOPY (EGD) WITH PROPOFOL N/A 05/21/2019   Procedure: ESOPHAGOGASTRODUODENOSCOPY (EGD) WITH PROPOFOL;  Surgeon: Midge Minium, MD;  Location[facility];  Service: Endoscopy;  Laterality: N/A;   Social History   Socioeconomic History   Marital status: Married    Spouse name: Milfred Krammes   Number of children: 2   Years of education: [employer] education level: Some college, no degree  Occupational History   Occupation: Actor    Comment:  Geneticist, 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)  Tobacco Use   Smoking status: Never   Smokeless tobacco: Never  Vaping Use   Vaping status: Never Used  Substance and Sexual Activity   Alcohol use: No   Drug use: No   Sexual activity: Not on file  Other Topics Concern   Not on file  Social History Narrative   Not on file   Social Drivers of Health   Financial Resource Strain: Low Risk  (11/05/2023)   Overall Financial Resource Strain (CARDIA)    Difficulty of Paying Living Expenses: Not hard at all  Food Insecurity: No Food Insecurity (11/05/2023)   Hunger Vital Sign    Worried About Running Out of Food in the Last Year: Never true    Ran Out of Food in the Last Year: Never true  Transportation Needs: No Transportation Needs (11/05/2023)   PRAPARE - Administrator, Civil Service (Medical): No    Lack of Transportation (Non-Medical): No  Physical Activity: Insufficiently Active (11/05/2023)   Exercise Vital Sign    Days of Exercise per Week: 1 day    Minutes of Exercise per Session: 10 min  Stress: No Stress Concern Present (11/05/2023)   [employer] of Occupational Health - Occupational Stress Questionnaire    Feeling of Stress : Only a little  Social Connections: Moderately Integrated (11/05/2023)   Social Connection and Isolation Panel [NHANES]    Frequency of Communication with Friends and Family: Three times a week    Frequency of Social Gatherings with Friends and Family: Once a week    Attends Religious Services: More than 4 times per year    Active Member of [employer] or Organizations: No    Attends Engineer, structural: Not on file    Marital Status: Married  Catering manager Violence: Not on file   Family History  Problem Relation Age of Onset   Diabetes Father    Cancer Father        skin   Hypertension Father    Prostate cancer Father        late 83s early 30s   Barrett's esophagus Father    Heart disease Father    Lung cancer Maternal  Grandfather    Bladder Cancer Maternal Grandfather    Current Outpatient Medications on File Prior to Visit  Medication Sig   esomeprazole (NEXIUM) 40 MG capsule TAKE 1 CAPSULE BY MOUTH DAILY  BEFORE BREAKFAST   ursodiol (ACTIGALL) 500 MG tablet TAKE 1 TABLET BY MOUTH 3 TIMES  DAILY   No current facility-administered medications on file prior to visit.    Review of Systems  Constitutional:  Negative for activity change, appetite change, chills, diaphoresis, fatigue and fever.  HENT:  Negative for congestion and hearing loss.   Eyes:  Negative for visual disturbance.  Respiratory:  Negative for cough, chest tightness, shortness of breath and wheezing.   Cardiovascular:  Negative for chest pain, palpitations and leg swelling.  Gastrointestinal:  Negative for abdominal pain, constipation, diarrhea, nausea and vomiting.  Genitourinary:  Negative for dysuria, frequency and hematuria.  Musculoskeletal:  Negative for arthralgias and neck pain.  Skin:  Positive for rash (R upper ext shingles).  Neurological:  Negative for dizziness, weakness, light-headedness, numbness and headaches.  Hematological:  Negative for adenopathy.  Psychiatric/Behavioral:  Negative for behavioral problems, dysphoric mood and sleep disturbance.    Per HPI unless specifically indicated above     Objective:    BP 118/70   Pulse 66   Ht 5\' 10"  (1.778 m)   Wt 209 lb (94.8 kg)   SpO2 97%   BMI 29.99 kg/m   Wt Readings from Last 3 Encounters:  11/08/23 209 lb (94.8 kg)  11/06/22 201 lb 9.6 oz (91.4 kg)  01/11/22 201 lb (91.2 kg)    Physical Exam Vitals and nursing note reviewed.  Constitutional:      General: He is not in acute distress.    Appearance: He is well-developed. He is not diaphoretic.     Comments: Well-appearing, comfortable, cooperative  HENT:     Head: Normocephalic and atraumatic.  Eyes:     General:        Right eye: No discharge.        Left eye: No discharge.      Conjunctiva/sclera: Conjunctivae normal.     Pupils: Pupils are equal, round, and reactive to light.  Neck:     Thyroid: No thyromegaly.  Cardiovascular:     Rate and Rhythm: Normal rate and regular rhythm.     Pulses: Normal pulses.     Heart sounds: Normal heart sounds. No murmur heard. Pulmonary:     Effort: Pulmonary effort is normal. No respiratory distress.     Breath sounds: Normal breath sounds. No wheezing or rales.  Abdominal:     General: Bowel sounds are normal. There is no distension.     Palpations: Abdomen is soft. There is no mass.     Tenderness: There is no abdominal tenderness.  Musculoskeletal:        General: No tenderness. Normal range of motion.     Cervical back: Normal range of motion and neck supple.     Comments: Upper / Lower Extremities: - Normal muscle tone, strength bilateral upper extremities 5/5, lower extremities 5/5  Lymphadenopathy:     Cervical: No cervical adenopathy.  Skin:    General: Skin is warm and dry.     Findings: Rash (Resolving R upper ext shingles rash without active blister) present. No erythema.  Neurological:     Mental Status: He is alert and oriented to person, place, and time.     Comments: Distal sensation intact to light touch all extremities  Psychiatric:        Mood and Affect: Mood normal.        Behavior: Behavior normal.        Thought Content: Thought content normal.     Comments: Well groomed, good eye contact, normal speech and thoughts     Results for orders placed or performed in visit on 02/27/23  Hepatic function panel   Collection Time: 02/27/23  3:29 PM  Result Value Ref Range   Total Protein 6.9 6.1 -  8.1 g/dL   Albumin 4.4 3.6 - 5.1 g/dL   Globulin 2.5 1.9 - 3.7 g/dL (calc)   AG Ratio 1.8 1.0 - 2.5 (calc)   Total Bilirubin 1.1 0.2 - 1.2 mg/dL   Bilirubin, Direct 0.2 0.0 - 0.2 mg/dL   Indirect Bilirubin 0.9 0.2 - 1.2 mg/dL (calc)   Alkaline phosphatase (APISO) 129 36 - 130 U/L   AST 27 10 - 40 U/L    ALT 36 9 - 46 U/L      Assessment & Plan:   Problem List Items Addressed This Visit     Hyperlipidemia   Relevant Orders   Lipid panel   COMPLETE METABOLIC PANEL WITH GFR   TSH   Overweight (BMI 25.0-29.9)   Relevant Orders   Lipid panel   COMPLETE METABOLIC PANEL WITH GFR   Primary biliary cholangitis (HCC)   Relevant Orders   COMPLETE METABOLIC PANEL WITH GFR   CBC with Differential/Platelet   Other Visit Diagnoses       Annual physical exam    -  Primary   Relevant Orders   Lipid panel   Hemoglobin A1c   COMPLETE METABOLIC PANEL WITH GFR   CBC with Differential/Platelet   PSA   TSH     Screening for prostate cancer       Relevant Orders   PSA     Abnormal glucose       Relevant Orders   Hemoglobin A1c     Herpes zoster without complication       Relevant Medications   valACYclovir (VALTREX) 1000 MG tablet        Updated Health Maintenance information Fasting labs after visit Encouraged improvement to lifestyle with diet and exercise Goal of weight loss  Recurrent Herpes Zoster Resolving rash. No active blister. Same distribution of HSV flare Recent flare-up managed with leftover Valtrex.  -Refill Valtrex prescription for future flare-ups.  Hyperlipidemia Last cholesterol level mildly elevated 120s. No current medication. Patient aware of lifestyle modifications. -Monitor cholesterol levels with today's blood work.  General Health Maintenance -Consider coronary artery calcium score for heart disease screening. -Discussed colon cancer screening options at age 52 (colonoscopy vs Cologuard). -Update tetanus shot in 2026.  History of Primary Biliary Cholangitis He has done well without further complication at this time. Not following GI -Continue monitoring liver function tests due to past issues. Notify GI if levels are elevated.  -Annual check-up and labs in one year.         Orders Placed This Encounter  Procedures   Lipid panel    Has  the patient fasted?:   Yes   Hemoglobin A1c   COMPLETE METABOLIC PANEL WITH GFR   CBC with Differential/Platelet   PSA   TSH    Meds ordered this encounter  Medications   valACYclovir (VALTREX) 1000 MG tablet    Sig: Take 1 tablet (1,000 mg total) by mouth 3 (three) times daily. For shingles    Dispense:  21 tablet    Refill:  0     Follow up plan: Return for 1 year Annual Physical labs after.  Saralyn Pilar, DO [facility] 11/08/2023, 9:01 AM

## 2023-11-08 NOTE — Patient Instructions (Addendum)
 Thank you for coming to the office today.  Labs today  Colon CA Screening age 44+ Colonoscopy vs Cologuard  Consider this option for screening Coronary Calcium Score Cardiac CT Scan. This is a screening test for patients aged 51-50+ with cardiovascular risk factors or who are healthy but would be interested in Cardiovascular Screening for heart disease. Even if there is a family history of heart disease, this imaging can be useful. Typically it can be done every 5+ years or at a different timeline we agree on  The scan will look at the chest and mainly focus on the heart and identify early signs of calcium build up or blockages within the heart arteries. It is not 100% accurate for identifying blockages or heart disease, but it is useful to help [place] predict who may have some early changes or be at risk in the future for a heart attack or cardiovascular problem.  The results are reviewed by a Cardiologist and they will document the results. It should become available on MyChart. Typically the results are divided into percentiles based on other patients of the same demographic and age. So it will compare your risk to others similar to you. If you have a higher score >99 or higher percentile >75%tile, it is recommended to consider Statin cholesterol therapy and or referral to Cardiologist. I will try to help explain your results and if we have questions we can contact the Cardiologist.  You will be contacted for scheduling. Usually it is done at any imaging facility through [facility], [facility] or [facility].  The cost is $99 flat fee total and it does not go through insurance, so no authorization is required.   DUE for FASTING BLOOD WORK (no food or drink after midnight before the lab appointment, only water or coffee without cream/sugar on the morning of)  SCHEDULE "Lab Only" visit in the morning at the clinic for lab draw in 1 YEAR  - Make sure Lab Only  appointment is at about 1 week before your next appointment, so that results will be available  For Lab Results, once available within 2-3 days of blood draw, you can can log in to MyChart online to view your results and a brief explanation. Also, we can discuss results at next follow-up visit. '  Please schedule a Follow-up Appointment to: Return for 1 year Annual Physical labs after.  If you have any other questions or concerns, please feel free to call the office or send a message through MyChart. You may also schedule an earlier appointment if necessary.  Additionally, you may be receiving a survey about your experience at our office within a few days to 1 week by e-mail or mail. We value your feedback.  Saralyn Pilar, DO [facility], [place]

## 2023-11-09 LAB — PSA: PSA: 0.62 ng/mL (ref <=4.00)

## 2023-11-09 LAB — COMPLETE METABOLIC PANEL WITH GFR
AG Ratio: 1.8 (calc) (ref 1.0–2.5)
ALT: 33 U/L (ref 9–46)
AST: 25 U/L (ref 10–40)
Albumin: 4.6 g/dL (ref 3.6–5.1)
Alkaline phosphatase (APISO): 111 U/L (ref 36–130)
BUN: 18 mg/dL (ref 7–25)
CO2: 31 mmol/L (ref 20–32)
Calcium: 9.6 mg/dL (ref 8.6–10.3)
Chloride: 104 mmol/L (ref 98–110)
Creat: 0.89 mg/dL (ref 0.60–1.29)
Globulin: 2.6 g/dL (ref 1.9–3.7)
Glucose, Bld: 101 mg/dL — ABNORMAL HIGH (ref 65–99)
Potassium: 4.7 mmol/L (ref 3.5–5.3)
Sodium: 140 mmol/L (ref 135–146)
Total Bilirubin: 0.9 mg/dL (ref 0.2–1.2)
Total Protein: 7.2 g/dL (ref 6.1–8.1)
eGFR: 109 mL/min/{1.73_m2} (ref >=60)

## 2023-11-09 LAB — CBC WITH DIFFERENTIAL/PLATELET
Absolute Lymphocytes: 2071 {cells}/uL (ref 850–3900)
Absolute Monocytes: 502 {cells}/uL (ref 200–950)
Basophils Absolute: 30 {cells}/uL (ref 0–200)
Basophils Relative: 0.5 %
Eosinophils Absolute: 313 {cells}/uL (ref 15–500)
Eosinophils Relative: 5.3 %
HCT: 45 % (ref 38.5–50.0)
Hemoglobin: 15.6 g/dL (ref 13.2–17.1)
MCH: 31.1 pg (ref 27.0–33.0)
MCHC: 34.7 g/dL (ref 32.0–36.0)
MCV: 89.8 fL (ref 80.0–100.0)
MPV: 9.7 fL (ref 7.5–12.5)
Monocytes Relative: 8.5 %
Neutro Abs: 2985 {cells}/uL (ref 1500–7800)
Neutrophils Relative %: 50.6 %
Platelets: 300 10*3/uL (ref 140–400)
RBC: 5.01 10*6/uL (ref 4.20–5.80)
RDW: 12.8 % (ref 11.0–15.0)
Total Lymphocyte: 35.1 %
WBC: 5.9 10*3/uL (ref 3.8–10.8)

## 2023-11-09 LAB — LIPID PANEL
Cholesterol: 220 mg/dL — ABNORMAL HIGH (ref <200)
HDL: 76 mg/dL (ref >=40)
LDL Cholesterol (Calc): 130 mg/dL — ABNORMAL HIGH
Non-HDL Cholesterol (Calc): 144 mg/dL — ABNORMAL HIGH (ref <130)
Total CHOL/HDL Ratio: 2.9 (calc) (ref <5.0)
Triglycerides: 52 mg/dL (ref <150)

## 2023-11-09 LAB — HEMOGLOBIN A1C
Hgb A1c MFr Bld: 5.5 %{Hb} (ref <5.7)
Mean Plasma Glucose: 111 mg/dL
eAG (mmol/L): 6.2 mmol/L

## 2023-11-12 ENCOUNTER — Encounter: Payer: Self-pay | Admitting: Family Medicine

## 2024-03-14 ENCOUNTER — Other Ambulatory Visit: Payer: Self-pay | Admitting: Gastroenterology

## 2024-03-17 ENCOUNTER — Other Ambulatory Visit: Payer: Self-pay | Admitting: Family Medicine

## 2024-03-17 DIAGNOSIS — K219 Gastro-esophageal reflux disease without esophagitis: Secondary | ICD-10-CM

## 2024-03-19 NOTE — Telephone Encounter (Signed)
 Requested Prescriptions  Pending Prescriptions Disp Refills   esomeprazole  (NEXIUM ) 40 MG capsule [Pharmacy Med Name: Esomeprazole  Magnesium  40 MG Oral Capsule Delayed Release] 90 capsule 2    Sig: TAKE 1 CAPSULE BY MOUTH DAILY  BEFORE BREAKFAST     Gastroenterology: Proton Pump Inhibitors 2 Passed - 03/19/2024  4:47 PM      Passed - ALT in normal range and within 360 days    ALT  Date Value Ref Range Status  11/08/2023 33 9 - 46 U/L Final         Passed - AST in normal range and within 360 days    AST  Date Value Ref Range Status  11/08/2023 25 10 - 40 U/L Final         Passed - Valid encounter within last 12 months    Recent Outpatient Visits           4 months ago Annual physical exam   [place] [facility] [place], Marsa PARAS, DO

## 2024-06-15 ENCOUNTER — Ambulatory Visit (INDEPENDENT_AMBULATORY_CARE_PROVIDER_SITE_OTHER)

## 2024-06-15 DIAGNOSIS — Z23 Encounter for immunization: Secondary | ICD-10-CM | POA: Diagnosis not present

## 2024-07-30 ENCOUNTER — Encounter (INDEPENDENT_AMBULATORY_CARE_PROVIDER_SITE_OTHER): Payer: Self-pay | Admitting: Family Medicine

## 2024-07-30 DIAGNOSIS — B029 Zoster without complications: Secondary | ICD-10-CM | POA: Diagnosis not present

## 2024-07-30 MED ORDER — VALACYCLOVIR HCL 1 G PO TABS: 1000.0000 mg | ORAL_TABLET | Freq: Three times a day (TID) | ORAL | 1 refills | Status: AC

## 2024-07-30 NOTE — Telephone Encounter (Signed)
Please see the MyChart message reply(ies) for my assessment and plan.    This patient gave consent for this Medical Advice Message and is aware that it may result in a bill to their insurance company, as well as the possibility of receiving a bill for a co-payment or deductible. They are an established patient, but are not seeking medical advice exclusively about a problem treated during an in person or video visit in the last seven days. I did not recommend an in person or video visit within seven days of my reply.    I spent a total of 5 minutes cumulative time within 7 days through MyChart messaging.  Axelle Szwed, DO   

## 2024-10-08 ENCOUNTER — Encounter: Payer: Self-pay | Admitting: Family Medicine

## 2024-10-08 ENCOUNTER — Telehealth: Payer: Self-pay

## 2024-10-08 DIAGNOSIS — K743 Primary biliary cirrhosis: Secondary | ICD-10-CM

## 2024-10-08 MED ORDER — URSODIOL 500 MG PO TABS: 500.0000 mg | ORAL_TABLET | Freq: Three times a day (TID) | ORAL | 0 refills | Status: DC

## 2024-10-08 NOTE — Addendum Note (Signed)
 Addended by: EDMAN MARSA PARAS on: 10/08/2024 02:45 PM   Modules accepted: Orders

## 2024-10-08 NOTE — Telephone Encounter (Signed)
 I attempted to call him did not reach him. I will send this on mychart.  I reviewed his chart and it shows Dr Jinny was prescribing rx Ursodiol  for him due to history of liver / biliary disease. He was started on this back in 2023 for elevated Liver Enzymes. He has continued it since that time.   Last refill was ordered by Dr Jinny 03/2024.  His liver enzymes have been normal for a few years now. I would need further input from Dr Jinny or a new GI provider if he re-establishes on the plan for this medication. I would not order it long term without advice from GI at this point. Usually it is used for 1-2 years approximately.  I can re order a 90 day supply if he wants to continue until March 2026 at his next visit with him. Or he can pause it and we can check his labs before next apt.  I would recommend that he contact [facility] to schedule with Dr Jinny or likely new provider to be seen in office to discuss next steps.  Marsa Officer, DO [facility] 10/08/2024, 2:38 PM

## 2024-10-08 NOTE — Telephone Encounter (Signed)
 Agreeable to 90 supply and to discuss at physical

## 2024-10-09 ENCOUNTER — Other Ambulatory Visit: Payer: Self-pay

## 2024-10-09 DIAGNOSIS — K743 Primary biliary cirrhosis: Secondary | ICD-10-CM

## 2024-10-09 MED ORDER — URSODIOL 500 MG PO TABS: 500.0000 mg | ORAL_TABLET | Freq: Three times a day (TID) | ORAL | 0 refills | Status: AC

## 2024-11-03 ENCOUNTER — Other Ambulatory Visit

## 2024-11-09 ENCOUNTER — Encounter: Admitting: Family Medicine
